# Patient Record
Sex: Female | Born: 1935 | Race: White | Hispanic: No | Smoking: Never smoker
Health system: Southern US, Community
[De-identification: ages and names within clinical notes are randomized; demographics above are authoritative.]

## PROBLEM LIST (undated history)

## (undated) DIAGNOSIS — I10 Essential (primary) hypertension: Secondary | ICD-10-CM

## (undated) DIAGNOSIS — E785 Hyperlipidemia, unspecified: Secondary | ICD-10-CM

## (undated) DIAGNOSIS — M1990 Unspecified osteoarthritis, unspecified site: Secondary | ICD-10-CM

## (undated) DIAGNOSIS — H269 Unspecified cataract: Secondary | ICD-10-CM

## (undated) DIAGNOSIS — E119 Type 2 diabetes mellitus without complications: Secondary | ICD-10-CM

## (undated) DIAGNOSIS — E271 Primary adrenocortical insufficiency: Secondary | ICD-10-CM

## (undated) HISTORY — PX: WRIST SURGERY: SHX841

## (undated) HISTORY — PX: CORNEAL TRANSPLANT: SHX108

## (undated) HISTORY — PX: LUMBAR LAMINECTOMY: SHX95

## (undated) HISTORY — PX: EYE SURGERY: SHX253

---

## 2005-02-03 ENCOUNTER — Ambulatory Visit: Payer: Self-pay | Admitting: Internal Medicine

## 2006-02-05 ENCOUNTER — Ambulatory Visit: Payer: Self-pay | Admitting: Internal Medicine

## 2007-03-02 ENCOUNTER — Ambulatory Visit: Payer: Self-pay | Admitting: Internal Medicine

## 2008-03-06 ENCOUNTER — Ambulatory Visit: Payer: Self-pay | Admitting: Internal Medicine

## 2009-03-19 ENCOUNTER — Ambulatory Visit: Payer: Self-pay | Admitting: Internal Medicine

## 2010-01-26 ENCOUNTER — Emergency Department: Payer: Self-pay | Admitting: Unknown Physician Specialty

## 2010-03-21 ENCOUNTER — Ambulatory Visit: Payer: Self-pay | Admitting: Internal Medicine

## 2010-05-04 ENCOUNTER — Emergency Department: Payer: Self-pay | Admitting: Emergency Medicine

## 2010-06-30 ENCOUNTER — Ambulatory Visit: Payer: Self-pay | Admitting: Family Medicine

## 2013-11-12 ENCOUNTER — Ambulatory Visit: Payer: Self-pay | Admitting: Physician Assistant

## 2014-10-15 ENCOUNTER — Encounter: Payer: Self-pay | Admitting: Gynecology

## 2014-10-15 ENCOUNTER — Ambulatory Visit
Admission: EM | Admit: 2014-10-15 | Discharge: 2014-10-15 | Disposition: A | Discharge status: Home / Self Care | Payer: Medicare Other | Attending: Emergency Medicine | Admitting: Emergency Medicine

## 2014-10-15 DIAGNOSIS — E119 Type 2 diabetes mellitus without complications: Secondary | ICD-10-CM | POA: Insufficient documentation

## 2014-10-15 DIAGNOSIS — X58XXXA Exposure to other specified factors, initial encounter: Secondary | ICD-10-CM | POA: Insufficient documentation

## 2014-10-15 DIAGNOSIS — E785 Hyperlipidemia, unspecified: Secondary | ICD-10-CM | POA: Insufficient documentation

## 2014-10-15 DIAGNOSIS — I10 Essential (primary) hypertension: Secondary | ICD-10-CM | POA: Insufficient documentation

## 2014-10-15 DIAGNOSIS — E271 Primary adrenocortical insufficiency: Secondary | ICD-10-CM | POA: Insufficient documentation

## 2014-10-15 DIAGNOSIS — R238 Other skin changes: Secondary | ICD-10-CM | POA: Diagnosis not present

## 2014-10-15 DIAGNOSIS — S00521A Blister (nonthermal) of lip, initial encounter: Secondary | ICD-10-CM | POA: Insufficient documentation

## 2014-10-15 DIAGNOSIS — Z7982 Long term (current) use of aspirin: Secondary | ICD-10-CM | POA: Insufficient documentation

## 2014-10-15 HISTORY — DX: Unspecified osteoarthritis, unspecified site: M19.90

## 2014-10-15 HISTORY — DX: Primary adrenocortical insufficiency: E27.1

## 2014-10-15 HISTORY — DX: Type 2 diabetes mellitus without complications: E11.9

## 2014-10-15 HISTORY — DX: Unspecified cataract: H26.9

## 2014-10-15 HISTORY — DX: Hyperlipidemia, unspecified: E78.5

## 2014-10-15 HISTORY — DX: Essential (primary) hypertension: I10

## 2014-10-15 MED ORDER — VALACYCLOVIR HCL 1 G PO TABS: 1000.0000 mg | ORAL_TABLET | Freq: Two times a day (BID) | ORAL | Status: DC

## 2014-10-15 MED ORDER — MUPIROCIN 2 % EX OINT: 1.0000 "application " | TOPICAL_OINTMENT | Freq: Three times a day (TID) | CUTANEOUS | Status: DC

## 2014-10-15 NOTE — Discharge Instructions (Signed)
I'm going to treat you empirically for cold sores, which is caused by a herpes virus. We will have a definitive diagnosis in several days. If your culture comes back negative, then this was caused by sun poisoning, and not by herpes. If this happens again, refill theValtrex. Take 2 pills in the morning and 2 pills at night. You will only need to take this for one day. Start it as soon as possible after the symptom onset.  I'm also treating you for secondary infection with the Bactroban. Use as directed. Also continue the Carmex. Marland Kitchen

## 2014-10-15 NOTE — ED Provider Notes (Signed)
HPI  SUBJECTIVE:  Jodi Best is a 81 y.o. female who presents with painful lower lip blisters that started 3 days ago. States that she was out in the sun over the weekend, but that the lip swelled up and blisters appeared several days later. She denies any prodromal symptoms. She states that she did not get sunburned on her face. She states that the she is now having burning, tingling pain with the blisters. She has tried Cormax, Neosporin and herpecintopical antiviral. There are no aggravating or alleviating factors. She denies fevers, rash elsewhere, sore throat. She does have some crusting. Had similar symptoms before when was sunburned on her leg. Past medical history of diabetes, hypertension, Addison's disease. This is under good control. No known history of HSV.  Past Medical History  Diagnosis Date  . Hypertension   . Diabetes mellitus without complication   . Osteoarthritis   . Addison disease   . Hyperlipemia   . Cataract     Past Surgical History  Procedure Laterality Date  . Lumbar laminectomy    . Eye surgery    . Corneal transplant    . Wrist surgery      No family history on file.  History  Substance Use Topics  . Smoking status: Never Smoker   . Smokeless tobacco: Not on file  . Alcohol Use: No    No current facility-administered medications for this encounter.  Current outpatient prescriptions:  .  amLODipine (NORVASC) 10 MG tablet, Take 10 mg by mouth daily., Disp: , Rfl:  .  aspirin EC 81 MG tablet, Take 81 mg by mouth daily., Disp: , Rfl:  .  calcium-vitamin D (OSCAL WITH D) 500-200 MG-UNIT per tablet, Take 1 tablet by mouth., Disp: , Rfl:  .  Cholecalciferol 1000 UNITS capsule, Take 1,000 Units by mouth daily., Disp: , Rfl:  .  fluticasone (FLONASE) 50 MCG/ACT nasal spray, Place into both nostrils daily., Disp: , Rfl:  .  hydrocortisone (CORTEF) 20 MG tablet, Take 20 mg by mouth daily., Disp: , Rfl:  .  lovastatin (MEVACOR) 40 MG tablet, Take 40 mg  by mouth at bedtime., Disp: , Rfl:  .  metFORMIN (GLUCOPHAGE) 500 MG tablet, Take by mouth 2 (two) times daily with a meal., Disp: , Rfl:  .  metoprolol (LOPRESSOR) 50 MG tablet, Take 50 mg by mouth 2 (two) times daily., Disp: , Rfl:  .  prednisoLONE acetate (PRED FORTE) 1 % ophthalmic suspension, 1 drop 4 (four) times daily., Disp: , Rfl:  .  travoprost, benzalkonium, (TRAVATAN) 0.004 % ophthalmic solution, 1 drop at bedtime., Disp: , Rfl:  .  mupirocin ointment (BACTROBAN) 2 %, Apply 1 application topically 3 (three) times daily., Disp: 22 g, Rfl: 0 .  valACYclovir (VALTREX) 1000 MG tablet, Take 1 tablet (1,000 mg total) by mouth 2 (two) times daily. X 5 days., Disp: 10 tablet, Rfl: 1  Allergies  Allergen Reactions  . Codeine     unlnown  . Penicillins     unknown     ROS  As noted in HPI.   Physical Exam  BP 141/63 mmHg  Pulse 76  Temp(Src) 97.9 F (36.6 C) (Oral)  Ht  (1.6 m)  Wt 170 lb (77.111 kg)  BMI 30.12 kg/m2  SpO2 99%  Constitutional: Well developed, well nourished, no acute distress Eyes:  EOMI, conjunctiva normal bilaterally HENT: Normocephalic, atraumatic,mucus membranes moist,+ tender erosions of her lower lip and on chin with some crusting,  oropharynx normal  Respiratory: Normal inspiratory effort Cardiovascular: Normal rate GI; nondistended skin: no other rash Lymph: No cervical lymphadenopathy Musculoskeletal: no deformities Neurologic: Alert & oriented x 3, no focal neuro deficits Psychiatric: Speech and behavior appropriate   ED Course   Medications - No data to display  Orders Placed This Encounter  Procedures  . Herpes simplex virus culture    Standing Status: Standing     Number of Occurrences: 1     Standing Expiration Date:     Order Specific Question:  Patient immune status    Answer:  Immunocompromised    No results found for this or any previous visit (from the past 24 hour(s)). No results found.  ED Clinical  Impression  Blisters of multiple sites  ED Assessment/Plan Presentation is most consistent with HSV outbreak so sent home with Valtrex.  also in the differential is sun poisoning, impetigo, patient does have some honey colored crusts with the blisters, so will treat for secondary infection with Bactroban.   Discussed labs,MDM, plan and followup with patient / family. Discussed sn/sx that should prompt return to the UC or ED. Patient  agrees with plan.   *This clinic note was created using Dragon dictation software. Therefore, there may be occasional mistakes despite careful proofreading.  ?    Domenick Gong, MD 10/15/14 1044

## 2014-10-15 NOTE — ED Notes (Signed)
Patient c/o blistered on bottom lip x 5 days.

## 2014-10-17 LAB — HERPES SIMPLEX VIRUS CULTURE

## 2014-10-18 LAB — HSV CULTURE AND TYPING

## 2016-05-16 ENCOUNTER — Inpatient Hospital Stay
Admission: EM | Admit: 2016-05-16 | Discharge: 2016-05-22 | DRG: 871 | Disposition: A | Discharge status: Skilled Nursing Facility | Payer: Medicare Other | Attending: Internal Medicine | Admitting: Internal Medicine

## 2016-05-16 ENCOUNTER — Encounter: Payer: Self-pay | Admitting: Emergency Medicine

## 2016-05-16 ENCOUNTER — Emergency Department: Payer: Medicare Other

## 2016-05-16 DIAGNOSIS — L8990 Pressure ulcer of unspecified site, unspecified stage: Secondary | ICD-10-CM | POA: Insufficient documentation

## 2016-05-16 DIAGNOSIS — Z7952 Long term (current) use of systemic steroids: Secondary | ICD-10-CM | POA: Diagnosis not present

## 2016-05-16 DIAGNOSIS — E785 Hyperlipidemia, unspecified: Secondary | ICD-10-CM | POA: Diagnosis present

## 2016-05-16 DIAGNOSIS — Z7951 Long term (current) use of inhaled steroids: Secondary | ICD-10-CM | POA: Diagnosis not present

## 2016-05-16 DIAGNOSIS — E440 Moderate protein-calorie malnutrition: Secondary | ICD-10-CM | POA: Diagnosis present

## 2016-05-16 DIAGNOSIS — M1990 Unspecified osteoarthritis, unspecified site: Secondary | ICD-10-CM | POA: Diagnosis present

## 2016-05-16 DIAGNOSIS — K5900 Constipation, unspecified: Secondary | ICD-10-CM | POA: Diagnosis present

## 2016-05-16 DIAGNOSIS — Z885 Allergy status to narcotic agent status: Secondary | ICD-10-CM

## 2016-05-16 DIAGNOSIS — N170 Acute kidney failure with tubular necrosis: Secondary | ICD-10-CM | POA: Diagnosis present

## 2016-05-16 DIAGNOSIS — J181 Lobar pneumonia, unspecified organism: Secondary | ICD-10-CM

## 2016-05-16 DIAGNOSIS — J1008 Influenza due to other identified influenza virus with other specified pneumonia: Secondary | ICD-10-CM | POA: Diagnosis present

## 2016-05-16 DIAGNOSIS — F4323 Adjustment disorder with mixed anxiety and depressed mood: Secondary | ICD-10-CM | POA: Diagnosis present

## 2016-05-16 DIAGNOSIS — Z7984 Long term (current) use of oral hypoglycemic drugs: Secondary | ICD-10-CM | POA: Diagnosis not present

## 2016-05-16 DIAGNOSIS — E871 Hypo-osmolality and hyponatremia: Secondary | ICD-10-CM | POA: Diagnosis present

## 2016-05-16 DIAGNOSIS — J9601 Acute respiratory failure with hypoxia: Secondary | ICD-10-CM | POA: Diagnosis present

## 2016-05-16 DIAGNOSIS — A4101 Sepsis due to Methicillin susceptible Staphylococcus aureus: Secondary | ICD-10-CM | POA: Diagnosis present

## 2016-05-16 DIAGNOSIS — E1122 Type 2 diabetes mellitus with diabetic chronic kidney disease: Secondary | ICD-10-CM | POA: Diagnosis present

## 2016-05-16 DIAGNOSIS — L89151 Pressure ulcer of sacral region, stage 1: Secondary | ICD-10-CM | POA: Diagnosis present

## 2016-05-16 DIAGNOSIS — M6281 Muscle weakness (generalized): Secondary | ICD-10-CM

## 2016-05-16 DIAGNOSIS — E271 Primary adrenocortical insufficiency: Secondary | ICD-10-CM | POA: Diagnosis present

## 2016-05-16 DIAGNOSIS — R05 Cough: Secondary | ICD-10-CM | POA: Diagnosis present

## 2016-05-16 DIAGNOSIS — J189 Pneumonia, unspecified organism: Secondary | ICD-10-CM | POA: Diagnosis present

## 2016-05-16 DIAGNOSIS — Z6829 Body mass index (BMI) 29.0-29.9, adult: Secondary | ICD-10-CM

## 2016-05-16 DIAGNOSIS — N183 Chronic kidney disease, stage 3 (moderate): Secondary | ICD-10-CM | POA: Diagnosis present

## 2016-05-16 DIAGNOSIS — E860 Dehydration: Secondary | ICD-10-CM | POA: Diagnosis present

## 2016-05-16 DIAGNOSIS — I129 Hypertensive chronic kidney disease with stage 1 through stage 4 chronic kidney disease, or unspecified chronic kidney disease: Secondary | ICD-10-CM | POA: Diagnosis present

## 2016-05-16 DIAGNOSIS — J15211 Pneumonia due to Methicillin susceptible Staphylococcus aureus: Secondary | ICD-10-CM | POA: Diagnosis present

## 2016-05-16 DIAGNOSIS — Z880 Allergy status to penicillin: Secondary | ICD-10-CM | POA: Diagnosis not present

## 2016-05-16 DIAGNOSIS — J101 Influenza due to other identified influenza virus with other respiratory manifestations: Secondary | ICD-10-CM

## 2016-05-16 DIAGNOSIS — Z7982 Long term (current) use of aspirin: Secondary | ICD-10-CM | POA: Diagnosis not present

## 2016-05-16 DIAGNOSIS — R262 Difficulty in walking, not elsewhere classified: Secondary | ICD-10-CM

## 2016-05-16 LAB — CBC WITH DIFFERENTIAL/PLATELET
BASOS ABS: 0 10*3/uL (ref 0–0.1)
Basophils Relative: 0 %
Eosinophils Absolute: 0 10*3/uL (ref 0–0.7)
Eosinophils Relative: 0 %
HEMATOCRIT: 36 % (ref 35.0–47.0)
Hemoglobin: 12 g/dL (ref 12.0–16.0)
LYMPHS PCT: 15 %
Lymphs Abs: 2.6 10*3/uL (ref 1.0–3.6)
MCH: 27 pg (ref 26.0–34.0)
MCHC: 33.3 g/dL (ref 32.0–36.0)
MCV: 81 fL (ref 80.0–100.0)
Monocytes Absolute: 1.4 10*3/uL — ABNORMAL HIGH (ref 0.2–0.9)
Monocytes Relative: 8 %
NEUTROS ABS: 13.7 10*3/uL — AB (ref 1.4–6.5)
Neutrophils Relative %: 77 %
PLATELETS: 586 10*3/uL — AB (ref 150–440)
RBC: 4.44 MIL/uL (ref 3.80–5.20)
RDW: 16.3 % — AB (ref 11.5–14.5)
WBC: 17.8 10*3/uL — AB (ref 3.6–11.0)

## 2016-05-16 LAB — COMPREHENSIVE METABOLIC PANEL
ALT: 11 U/L — ABNORMAL LOW (ref 14–54)
ANION GAP: 13 (ref 5–15)
AST: 22 U/L (ref 15–41)
Albumin: 3.4 g/dL — ABNORMAL LOW (ref 3.5–5.0)
Alkaline Phosphatase: 92 U/L (ref 38–126)
BILIRUBIN TOTAL: 1 mg/dL (ref 0.3–1.2)
BUN: 59 mg/dL — ABNORMAL HIGH (ref 6–20)
CO2: 19 mmol/L — ABNORMAL LOW (ref 22–32)
Calcium: 8.9 mg/dL (ref 8.9–10.3)
Chloride: 96 mmol/L — ABNORMAL LOW (ref 101–111)
Creatinine, Ser: 2.75 mg/dL — ABNORMAL HIGH (ref 0.44–1.00)
GFR, EST AFRICAN AMERICAN: 17 mL/min — AB (ref >60)
GFR, EST NON AFRICAN AMERICAN: 15 mL/min — AB (ref >60)
Glucose, Bld: 148 mg/dL — ABNORMAL HIGH (ref 65–99)
POTASSIUM: 4.7 mmol/L (ref 3.5–5.1)
Sodium: 128 mmol/L — ABNORMAL LOW (ref 135–145)
TOTAL PROTEIN: 7.9 g/dL (ref 6.5–8.1)

## 2016-05-16 LAB — GLUCOSE, CAPILLARY
Glucose-Capillary: 114 mg/dL — ABNORMAL HIGH (ref 65–99)
Glucose-Capillary: 133 mg/dL — ABNORMAL HIGH (ref 65–99)
Glucose-Capillary: 144 mg/dL — ABNORMAL HIGH (ref 65–99)

## 2016-05-16 LAB — INFLUENZA PANEL BY PCR (TYPE A & B)
Influenza A By PCR: POSITIVE — AB
Influenza B By PCR: NEGATIVE

## 2016-05-16 LAB — EXPECTORATED SPUTUM ASSESSMENT W REFEX TO RESP CULTURE: SPECIAL REQUESTS: NORMAL

## 2016-05-16 LAB — EXPECTORATED SPUTUM ASSESSMENT W GRAM STAIN, RFLX TO RESP C

## 2016-05-16 LAB — BRAIN NATRIURETIC PEPTIDE: B Natriuretic Peptide: 271 pg/mL — ABNORMAL HIGH (ref 0.0–100.0)

## 2016-05-16 LAB — TROPONIN I: Troponin I: 0.05 ng/mL (ref <0.03)

## 2016-05-16 MED ORDER — ACETAMINOPHEN 325 MG PO TABS
650.0000 mg | ORAL_TABLET | Freq: Four times a day (QID) | ORAL | Status: DC | PRN
  Administered 2016-05-19: 650 mg via ORAL
  Filled 2016-05-16 (×2): qty 2

## 2016-05-16 MED ORDER — LORATADINE 10 MG PO TABS
10.0000 mg | ORAL_TABLET | Freq: Every day | ORAL | Status: DC
  Administered 2016-05-16 – 2016-05-22 (×7): 10 mg via ORAL
  Filled 2016-05-16 (×7): qty 1

## 2016-05-16 MED ORDER — ENOXAPARIN SODIUM 30 MG/0.3ML ~~LOC~~ SOLN
30.0000 mg | SUBCUTANEOUS | Status: DC
  Administered 2016-05-16: 30 mg via SUBCUTANEOUS
  Filled 2016-05-16: qty 0.3

## 2016-05-16 MED ORDER — LEVOFLOXACIN IN D5W 750 MG/150ML IV SOLN
750.0000 mg | INTRAVENOUS | Status: AC
  Administered 2016-05-16: 750 mg via INTRAVENOUS
  Filled 2016-05-16: qty 150

## 2016-05-16 MED ORDER — OSELTAMIVIR PHOSPHATE 30 MG PO CAPS
30.0000 mg | ORAL_CAPSULE | Freq: Every day | ORAL | Status: AC
  Administered 2016-05-16 – 2016-05-20 (×5): 30 mg via ORAL
  Filled 2016-05-16 (×4): qty 1

## 2016-05-16 MED ORDER — HYDROCORTISONE 10 MG PO TABS
20.0000 mg | ORAL_TABLET | Freq: Every day | ORAL | Status: DC
  Administered 2016-05-16 – 2016-05-22 (×7): 20 mg via ORAL
  Filled 2016-05-16 (×7): qty 2

## 2016-05-16 MED ORDER — CALCIUM CARBONATE ANTACID 500 MG PO CHEW
1500.0000 mg | CHEWABLE_TABLET | Freq: Two times a day (BID) | ORAL | Status: DC
  Administered 2016-05-16 – 2016-05-21 (×10): 1500 mg via ORAL
  Filled 2016-05-16 (×10): qty 3

## 2016-05-16 MED ORDER — ACETAMINOPHEN 650 MG RE SUPP: 650.0000 mg | Freq: Four times a day (QID) | RECTAL | Status: DC | PRN

## 2016-05-16 MED ORDER — ONDANSETRON HCL 4 MG PO TABS: 4.0000 mg | ORAL_TABLET | Freq: Four times a day (QID) | ORAL | Status: DC | PRN

## 2016-05-16 MED ORDER — IPRATROPIUM-ALBUTEROL 0.5-2.5 (3) MG/3ML IN SOLN
3.0000 mL | Freq: Once | RESPIRATORY_TRACT | Status: AC
  Administered 2016-05-16: 3 mL via RESPIRATORY_TRACT
  Filled 2016-05-16: qty 3

## 2016-05-16 MED ORDER — SODIUM CHLORIDE 0.9 % IV SOLN
INTRAVENOUS | Status: AC
  Administered 2016-05-16 – 2016-05-18 (×5): via INTRAVENOUS

## 2016-05-16 MED ORDER — ONDANSETRON HCL 4 MG/2ML IJ SOLN: 4.0000 mg | Freq: Four times a day (QID) | INTRAMUSCULAR | Status: DC | PRN

## 2016-05-16 MED ORDER — VITAMIN D 1000 UNITS PO TABS
1000.0000 [IU] | ORAL_TABLET | Freq: Every day | ORAL | Status: DC
  Administered 2016-05-16 – 2016-05-22 (×7): 1000 [IU] via ORAL
  Filled 2016-05-16 (×7): qty 1

## 2016-05-16 MED ORDER — OSELTAMIVIR PHOSPHATE 75 MG PO CAPS
75.0000 mg | ORAL_CAPSULE | Freq: Once | ORAL | Status: DC
  Filled 2016-05-16: qty 1

## 2016-05-16 MED ORDER — GUAIFENESIN ER 600 MG PO TB12
600.0000 mg | ORAL_TABLET | Freq: Two times a day (BID) | ORAL | Status: DC
Start: 2016-05-16 — End: 2016-05-16
  Filled 2016-05-16: qty 1

## 2016-05-16 MED ORDER — VALACYCLOVIR HCL 500 MG PO TABS
1000.0000 mg | ORAL_TABLET | Freq: Every day | ORAL | Status: DC
  Administered 2016-05-16 – 2016-05-19 (×4): 1000 mg via ORAL
  Filled 2016-05-16 (×4): qty 2

## 2016-05-16 MED ORDER — INSULIN ASPART 100 UNIT/ML ~~LOC~~ SOLN
0.0000 [IU] | Freq: Three times a day (TID) | SUBCUTANEOUS | Status: DC
  Administered 2016-05-16 – 2016-05-18 (×5): 1 [IU] via SUBCUTANEOUS
  Administered 2016-05-20 – 2016-05-22 (×3): 2 [IU] via SUBCUTANEOUS
  Filled 2016-05-16: qty 1
  Filled 2016-05-16 (×2): qty 2
  Filled 2016-05-16 (×3): qty 1
  Filled 2016-05-16: qty 2
  Filled 2016-05-16: qty 1

## 2016-05-16 MED ORDER — ORAL CARE MOUTH RINSE
15.0000 mL | Freq: Two times a day (BID) | OROMUCOSAL | Status: DC
  Administered 2016-05-16 – 2016-05-22 (×12): 15 mL via OROMUCOSAL

## 2016-05-16 MED ORDER — GUAIFENESIN ER 600 MG PO TB12
600.0000 mg | ORAL_TABLET | Freq: Two times a day (BID) | ORAL | Status: DC
  Administered 2016-05-16 – 2016-05-22 (×13): 600 mg via ORAL
  Filled 2016-05-16 (×12): qty 1

## 2016-05-16 MED ORDER — AMLODIPINE BESYLATE 10 MG PO TABS: 10.0000 mg | ORAL_TABLET | Freq: Every day | ORAL | Status: DC

## 2016-05-16 MED ORDER — DEXTROMETHORPHAN POLISTIREX ER 30 MG/5ML PO SUER
30.0000 mg | Freq: Two times a day (BID) | ORAL | Status: DC
  Administered 2016-05-16 – 2016-05-22 (×13): 30 mg via ORAL
  Filled 2016-05-16 (×17): qty 5

## 2016-05-16 MED ORDER — FLUTICASONE PROPIONATE 50 MCG/ACT NA SUSP
2.0000 | Freq: Every day | NASAL | Status: DC
  Administered 2016-05-16 – 2016-05-21 (×6): 2 via NASAL
  Filled 2016-05-16: qty 16

## 2016-05-16 MED ORDER — TRAVOPROST (BAK FREE) 0.004 % OP SOLN
1.0000 [drp] | Freq: Every day | OPHTHALMIC | Status: DC
  Administered 2016-05-17 – 2016-05-21 (×5): 1 [drp] via OPHTHALMIC
  Filled 2016-05-16: qty 2.5

## 2016-05-16 MED ORDER — PRAVASTATIN SODIUM 20 MG PO TABS
20.0000 mg | ORAL_TABLET | Freq: Every day | ORAL | Status: DC
  Administered 2016-05-16 – 2016-05-21 (×6): 20 mg via ORAL
  Filled 2016-05-16 (×6): qty 1

## 2016-05-16 MED ORDER — ASPIRIN EC 81 MG PO TBEC
81.0000 mg | DELAYED_RELEASE_TABLET | Freq: Every day | ORAL | Status: DC
  Administered 2016-05-16 – 2016-05-22 (×7): 81 mg via ORAL
  Filled 2016-05-16 (×7): qty 1

## 2016-05-16 MED ORDER — DM-GUAIFENESIN ER 30-600 MG PO TB12: 1.0000 | ORAL_TABLET | Freq: Two times a day (BID) | ORAL | Status: DC

## 2016-05-16 MED ORDER — LEVOFLOXACIN IN D5W 500 MG/100ML IV SOLN
500.0000 mg | INTRAVENOUS | Status: DC
  Filled 2016-05-16: qty 100

## 2016-05-16 MED ORDER — METOPROLOL TARTRATE 50 MG PO TABS
75.0000 mg | ORAL_TABLET | Freq: Two times a day (BID) | ORAL | Status: DC
  Administered 2016-05-16: 23:00:00 75 mg via ORAL
  Filled 2016-05-16: qty 1

## 2016-05-16 NOTE — ED Notes (Signed)
On arrival pt SpO2 84% RA, pt placed on 2-4L O2 via [place], SpO2 increased to 88%. Pt placed on NRB and HOB elevated to 90 degrees, SpO2 increased to 97%.

## 2016-05-16 NOTE — H&P (Addendum)
[facility] [place] [facility]   PATIENT NAME: Jodi Best    MR#:  [id number]  DATE OF BIRTH:  [birth year]  DATE OF ADMISSION:  05/16/2016  PRIMARY CARE PHYSICIAN:  Dr. Sydell Axon  REQUESTING/REFERRING PHYSICIAN:  MCshane MD  CHIEF COMPLAINT:   Chief Complaint  Patient presents with  . Flu-like symptoms    HISTORY OF PRESENT ILLNESS: Jodi Best  is a 83 y.o. female with a known history of Addison's disease, cataracts, diabetes type 2, essential hypertension, hyperlipidemia, osteoarthritis who is presenting with complaint of feeling weak and fatigue and now short of breath. According to the daughter she started feeling sick since [place]. Was nauseous and throwing up. She was seen by her primary care provider and told him that she had a virus. And then over the past few days patient has had trouble with breathing. She has not had any fevers according to the daughter but has not been feeling well. In the emergency room she was noted to have pneumonia as well as the flu.     PAST MEDICAL HISTORY:   Past Medical History:  Diagnosis Date  . Addison disease (HCC)   . Cataract   . Diabetes mellitus without complication (HCC)   . Hyperlipemia   . Hypertension   . Osteoarthritis     PAST SURGICAL HISTORY:  Past Surgical History:  Procedure Laterality Date  . CORNEAL TRANSPLANT    . EYE SURGERY    . LUMBAR LAMINECTOMY    . WRIST SURGERY      SOCIAL HISTORY:  Social History  Substance Use Topics  . Smoking status: Never Smoker  . Smokeless tobacco: Not on file  . Alcohol use No    FAMILY HISTORY:  Family History  Problem Relation Age of Onset  . Hypertension Mother     DRUG ALLERGIES:  Allergies  Allergen Reactions  . Codeine     unlnown  . Penicillins     Has patient had a PCN reaction causing immediate rash, facial/tongue/throat swelling, SOB or lightheadedness with hypotension: no Has patient had a PCN reaction causing severe rash  involving mucus membranes or skin necrosis: no Has patient had a PCN reaction that required hospitalization no Has patient had a PCN reaction occurring within the last 10 years: yes If all of the above answers are "NO", then may proceed with Cephalosporin use.     REVIEW OF SYSTEMS:   CONSTITUTIONAL: No fever, Positive fatigue positive weakness.  EYES: No blurred or double vision.  EARS, NOSE, AND THROAT: No tinnitus or ear pain.  RESPIRATORY: No cough, positive shortness of breath, no wheezing or hemoptysis.  CARDIOVASCULAR: No chest pain, orthopnea, edema.  GASTROINTESTINAL: No nausea, vomiting, diarrhea or abdominal pain.  GENITOURINARY: No dysuria, hematuria.  ENDOCRINE: No polyuria, nocturia,  HEMATOLOGY: No anemia, easy bruising or bleeding SKIN: No rash or lesion. MUSCULOSKELETAL: No joint pain or arthritis.   NEUROLOGIC: No tingling, numbness, weakness.  PSYCHIATRY: No anxiety or depression.   MEDICATIONS AT HOME:  Prior to Admission medications   Medication Sig Start Date End Date Taking? Authorizing Provider  amLODipine (NORVASC) 10 MG tablet Take 10 mg by mouth daily.    Historical Provider, MD  aspirin EC 81 MG tablet Take 81 mg by mouth daily.    Historical Provider, MD  calcium-vitamin D (OSCAL WITH D) 500-200 MG-UNIT per tablet Take 1 tablet by mouth.    Historical Provider, MD  Cholecalciferol 1000 UNITS capsule Take 1,000 Units by mouth  daily.    Historical Provider, MD  fluticasone (FLONASE) 50 MCG/ACT nasal spray Place into both nostrils daily.    Historical Provider, MD  hydrocortisone (CORTEF) 20 MG tablet Take 20 mg by mouth daily.    Historical Provider, MD  lovastatin (MEVACOR) 40 MG tablet Take 40 mg by mouth at bedtime.    Historical Provider, MD  metFORMIN (GLUCOPHAGE) 500 MG tablet Take by mouth 2 (two) times daily with a meal.    Historical Provider, MD  metoprolol (LOPRESSOR) 50 MG tablet Take 50 mg by mouth 2 (two) times daily.    Historical Provider,  MD  mupirocin ointment (BACTROBAN) 2 % Apply 1 application topically 3 (three) times daily. 10/15/14   Domenick Gong, MD  prednisoLONE acetate (PRED FORTE) 1 % ophthalmic suspension 1 drop 4 (four) times daily.    Historical Provider, MD  travoprost, benzalkonium, (TRAVATAN) 0.004 % ophthalmic solution 1 drop at bedtime.    Historical Provider, MD  valACYclovir (VALTREX) 1000 MG tablet Take 1 tablet (1,000 mg total) by mouth 2 (two) times daily. X 5 days. 10/15/14   Domenick Gong, MD      PHYSICAL EXAMINATION:   VITAL SIGNS: Blood pressure (!) 146/53, pulse 93, temperature 98.2 F (36.8 C), temperature source Oral, resp. rate 16, height 5\' 3"  (1.6 m), weight 165 lb (74.8 kg), SpO2 97 %.  GENERAL:  83 y.o.-year-old patient lying in the bed with no acute distress.  EYES: Pupils equal, round, reactive to light and accommodation. No scleral icterus. Extraocular muscles intact.  HEENT: Head atraumatic, normocephalic. Oropharynx and nasopharynx clear.  NECK:  Supple, no jugular venous distention. No thyroid enlargement, no tenderness.  LUNGS: Left lung rhonchus breath sounds, no wheezing no crackles CARDIOVASCULAR: S1, S2 normal. No murmurs, rubs, or gallops.  ABDOMEN: Soft, nontender, nondistended. Bowel sounds present. No organomegaly or mass.  EXTREMITIES: No pedal edema, cyanosis, or clubbing.  NEUROLOGIC: Cranial nerves II through XII are intact. Muscle strength 5/5 in all extremities. Sensation intact. Gait not checked.  PSYCHIATRIC: The patient is alert and oriented x 3.  SKIN: No obvious rash, lesion, or ulcer.   LABORATORY PANEL:   CBC  Recent Labs Lab 05/16/16 0909  WBC 17.8*  HGB 12.0  HCT 36.0  PLT 586*  MCV 81.0  MCH 27.0  MCHC 33.3  RDW 16.3*  LYMPHSABS 2.6  MONOABS 1.4*  EOSABS 0.0  BASOSABS 0.0   ------------------------------------------------------------------------------------------------------------------  Chemistries   Recent Labs Lab  05/16/16 0909  NA 128*  K 4.7  CL 96*  CO2 19*  GLUCOSE 148*  BUN 59*  CREATININE 2.75*  CALCIUM 8.9  AST 22  ALT 11*  ALKPHOS 92  BILITOT 1.0   ------------------------------------------------------------------------------------------------------------------ estimated creatinine clearance is 14.5 mL/min (by C-G formula based on SCr of 2.75 mg/dL (H)). ------------------------------------------------------------------------------------------------------------------ No results for input(s): TSH, T4TOTAL, T3FREE, THYROIDAB in the last 72 hours.  Invalid input(s): FREET3   Coagulation profile No results for input(s): INR, PROTIME in the last 168 hours. ------------------------------------------------------------------------------------------------------------------- No results for input(s): DDIMER in the last 72 hours. -------------------------------------------------------------------------------------------------------------------  Cardiac Enzymes  Recent Labs Lab 05/16/16 0909  TROPONINI 0.05*   ------------------------------------------------------------------------------------------------------------------ Invalid input(s): POCBNP  ---------------------------------------------------------------------------------------------------------------  Urinalysis No results found for: COLORURINE, APPEARANCEUR, LABSPEC, PHURINE, GLUCOSEU, HGBUR, BILIRUBINUR, KETONESUR, PROTEINUR, UROBILINOGEN, NITRITE, LEUKOCYTESUR   RADIOLOGY: Dg Chest 2 View  Result Date: 05/16/2016 CLINICAL DATA:  Shortness of breath and weakness for 2 weeks with productive cough. EXAM: CHEST  2 VIEW COMPARISON:  Single-view of the chest 01/26/2010.  FINDINGS: Focal left basilar airspace disease is identified. Right lung is clear. Heart size is mildly enlarged. No pneumothorax or pleural effusion. Aortic atherosclerosis is seen. No acute bony abnormality. IMPRESSION: Focal left basilar airspace disease  worrisome for pneumonia. Cardiomegaly without edema. Atherosclerosis. Electronically Signed   By: Drusilla Kannerhomas  Dalessio M.D.   On: 05/16/2016 09:09    EKG: Orders placed or performed during the hospital encounter of 05/16/16  . ED EKG  . ED EKG    IMPRESSION AND PLAN: Ration is a 83 year old with shortness of breath and fatigue and sob  1. Acute hypoxic respiratory failure Due to pneumonia We will treat with IV Levaquin Continue oxygen for supportive care  2. Influenza a We'll treat with Tamiflu  3. Addison's disease blood pressure is currently stable we'll continue her home regimen  4. Essential hypertension continue therapy with norvasc  5. Diabetes type 2 on sliding scale insulin and discontinue metformin and check a hemoglobin A1c  6. Acute renal failure will stop Lasix derived fluids monitor renal function  7. CODE STATUS discussed with the daughter she wants her mother to be full code  8. Miscellaneous Lovenox for DVT prophylaxis   All the records are reviewed and case discussed with ED provider. Management plans discussed with the patient, family and they are in agreement.  CODE STATUS: Code Status History    This patient does not have a recorded code status. Please follow your organizational policy for patients in this situation.       TOTAL TIME TAKING CARE OF THIS PATIENT: 55minutes.    Auburn BilberryPATEL, Annastyn Silvey M.D on 05/16/2016 at 11:18 AM  Between 7am to 6pm - Pager - [phone]  After 6pm go to [email or web address] - password [email or web address] [facility]  [facility]  Office  [phone]  CC: Primary care physician; No PCP Per Patient

## 2016-05-16 NOTE — ED Notes (Signed)
Dr. Quigley notified of troponin 0.05 

## 2016-05-16 NOTE — ED Notes (Signed)
Pt placed on monitor and O2. Dr. Huel CoteQuigley in room. Vitals taken.

## 2016-05-16 NOTE — ED Triage Notes (Signed)
Pt arrived via EMS from home for reports of flu-like symptoms for two weeks. Pt has been seen by PCP and has CXR scheduled for 0900 today. Pt daughter called EMS because pt would not come downstairs. EMS reports 160/68, 98.4 oral temperature, 94% RA.

## 2016-05-16 NOTE — ED Notes (Signed)
Patient transported to X-ray 

## 2016-05-16 NOTE — ED Provider Notes (Signed)
Time Seen: Approximately *0832  I have reviewed the triage notes  Chief Complaint: Flu-like symptoms   History of Present Illness: Jodi Best is a 83 y.o. female *who presents with some viral type symptoms over the last 2 weeks. She previously had some nausea vomiting and some loose watery stool. Now patient's presenting with more respiratory complaints. She's had a cough which is overall been dry and nonproductive. No fever. She had seen her primary physician was referred for a chest x-ray but she had increasing shortness of breath this morning. She denies any chest pain, leg pain or swelling   Past Medical History:  Diagnosis Date  . Addison disease (HCC)   . Cataract   . Diabetes mellitus without complication (HCC)   . Hyperlipemia   . Hypertension   . Osteoarthritis     There are no active problems to display for this patient.   Past Surgical History:  Procedure Laterality Date  . CORNEAL TRANSPLANT    . EYE SURGERY    . LUMBAR LAMINECTOMY    . WRIST SURGERY      Past Surgical History:  Procedure Laterality Date  . CORNEAL TRANSPLANT    . EYE SURGERY    . LUMBAR LAMINECTOMY    . WRIST SURGERY      Current Outpatient Rx  . Order #: [id number] Class: Historical Med  . Order #: [id number] Class: Historical Med  . Order #: [id number] Class: Historical Med  . Order #: [id number] Class: Historical Med  . Order #: [id number] Class: Historical Med  . Order #: [id number] Class: Historical Med  . Order #: [id number] Class: Historical Med  . Order #: [id number] Class: Historical Med  . Order #: [id number] Class: Historical Med  . Order #: [id number] Class: Print  . Order #: [id number] Class: Historical Med  . Order #: [id number] Class: Historical Med  . Order #: [id number] Class: Print    Allergies:  Codeine and Penicillins  Family History: No family history on file.  Social History: Social History  Substance Use Topics  . Smoking status: Never Smoker  . Smokeless tobacco:  Not on file  . Alcohol use No     Review of Systems:   10 point review of systems was performed and was otherwise negative:  Constitutional: No fever Eyes: No visual disturbances ENT: No sore throat, ear pain Cardiac: No chest pain Respiratory:Increasing shortness of breath with dry nonproductive cough Abdomen: No abdominal pain, no vomiting, No diarrhea Endocrine: No weight loss, No night sweats Extremities: No peripheral edema, cyanosis Skin: No rashes, easy bruising Neurologic: No focal weakness, trouble with speech or swollowing Urologic: No dysuria, Hematuria, or urinary frequency   Physical Exam:  ED Triage Vitals  Enc Vitals Group     BP 05/16/16 0837 (!) 146/53     Pulse Rate 05/16/16 0837 93     Resp 05/16/16 0837 16     Temp 05/16/16 0837 98.2 F (36.8 C)     Temp Source 05/16/16 0837 Oral     SpO2 05/16/16 0837 97 %     Weight 05/16/16 0839 165 lb (74.8 kg)     Height 05/16/16 0839 5\' 3"  (1.6 m)     Head Circumference --      Peak Flow --      Pain Score --      Pain Loc --      Pain Edu? --      Excl. in GC? --     General: Awake , Alert , and Oriented times 3; GCS 15  Head: Normal cephalic , atraumatic Eyes: Pupils equal , round, reactive to light Nose/Throat: No nasal drainage, patent upper airway without erythema or exudate.  Neck: Supple, Full range of motion, No anterior adenopathy or palpable thyroid masses Lungs: Mild rhonchi heard bilaterally to bases with no wheezes or rales noted  Heart: Regular rate, regular rhythm without murmurs , gallops , or rubs Abdomen: Soft, non tender without rebound, guarding , or rigidity; bowel sounds positive and symmetric in all 4 quadrants. No organomegaly .        Extremities: 2 plus symmetric pulses. No edema, clubbing or cyanosis Neurologic: normal ambulation, Motor symmetric without deficits, sensory intact Skin: warm, dry, no rashes   Labs:   All laboratory work was reviewed including any pertinent  negatives or positives listed below:  Labs Reviewed  RAPID INFLUENZA A&B ANTIGENS ([facility] ONLY)  BRAIN NATRIURETIC PEPTIDE  COMPREHENSIVE METABOLIC PANEL  CBC WITH DIFFERENTIAL/PLATELET  TROPONIN I  Patient's troponin is only slightly elevated. Influenza testing was positive for influenza A  Blood cultures 2 are pending  EKG:  ED ECG REPORT I, Jennye MoccasinBrian S Shneur [place], the attending physician, personally viewed and interpreted this ECG.  Date: 05/16/2016 EKG Time: 0907 Rate: 77 Rhythm: normal sinus rhythm QRS Axis: normal Intervals: normal ST/T Wave abnormalities: Nonspecific T wave abnormality Conduction Disturbances: none Narrative Interpretation: unremarkable No acute ischemic change  Radiology: * "Dg Chest 2 View  Result Date: 05/16/2016 CLINICAL DATA:  Shortness of breath and weakness for 2 weeks with productive cough. EXAM: CHEST  2 VIEW COMPARISON:  Single-view of the chest 01/26/2010. FINDINGS: Focal left basilar airspace disease is identified. Right lung is clear. Heart size is mildly enlarged. No pneumothorax or pleural effusion. Aortic atherosclerosis is seen. No acute bony abnormality. IMPRESSION: Focal left basilar airspace disease worrisome for pneumonia. Cardiomegaly without edema. Atherosclerosis. Electronically Signed   By: Drusilla Kannerhomas  Dalessio M.D.   On: 05/16/2016 09:09  "    I personally reviewed the radiologic studies  CRITICAL CARE Performed by: Jennye MoccasinBrian S Azure Budnick   Total critical care time: 35 minutes  Critical care time was exclusive of separately billable procedures and treating other patients.  Critical care was necessary to treat or prevent imminent or life-threatening deterioration.  Critical care was time spent personally by me on the following activities: development of treatment plan with patient and/or surrogate as well as nursing, discussions with consultants, evaluation of patient's response to treatment, examination of patient, obtaining history from  patient or surrogate, ordering and performing treatments and interventions, ordering and review of laboratory studies, ordering and review of radiographic studies, pulse oximetry and re-evaluation of patient's condition.    ED Course: * Patient's stay here was uneventful patient's otherwise hemodynamically stable. The patient does have hypoxemia with her pneumonia which may be either secondary to community-acquired bacterial pneumonia versus strictly influenza. She was given a DuoNeb here, fluid bolus due to dehydration and renal insufficiency and was started on Levaquin 750 mg IV after blood cultures 2. He remains slightly hypoxic when off of the nonrebreather at this time after her breathing treatment require further inpatient management  Clinical Course      Assessment: * Computed acquired pneumonia Influenza A Renal insufficiency Hypoxemia    Plan:  Inpatient            Jennye MoccasinBrian S Yasmyn Bellisario, MD 05/16/16 1028

## 2016-05-16 NOTE — Progress Notes (Signed)
Advanced care plan.  Purpose of the Encounter: CODE STATUS  Parties in Attendance:Daughter and Patient  Patient's Decision Capacity: Limited  Subjective/Patient's story: Pt is 83 y.o presents with weakness, noted to have PNA and Flu   Objective/Medical story Code status was discussed with daughter she wants her mother to have everything done including full code   Goals of care determination: agressive care    CODE STATUS:  FULL  Time spent discussing advanced care planning: 15 minutes

## 2016-05-17 DIAGNOSIS — L8990 Pressure ulcer of unspecified site, unspecified stage: Secondary | ICD-10-CM | POA: Insufficient documentation

## 2016-05-17 LAB — BASIC METABOLIC PANEL
ANION GAP: 11 (ref 5–15)
BUN: 60 mg/dL — ABNORMAL HIGH (ref 6–20)
CO2: 18 mmol/L — ABNORMAL LOW (ref 22–32)
Calcium: 8.3 mg/dL — ABNORMAL LOW (ref 8.9–10.3)
Chloride: 97 mmol/L — ABNORMAL LOW (ref 101–111)
Creatinine, Ser: 2.95 mg/dL — ABNORMAL HIGH (ref 0.44–1.00)
GFR calc Af Amer: 16 mL/min — ABNORMAL LOW (ref >60)
GFR, EST NON AFRICAN AMERICAN: 13 mL/min — AB (ref >60)
GLUCOSE: 84 mg/dL (ref 65–99)
POTASSIUM: 4.9 mmol/L (ref 3.5–5.1)
SODIUM: 126 mmol/L — AB (ref 135–145)

## 2016-05-17 LAB — BLOOD CULTURE ID PANEL (REFLEXED)
Acinetobacter baumannii: NOT DETECTED
CANDIDA GLABRATA: NOT DETECTED
CANDIDA KRUSEI: NOT DETECTED
CANDIDA PARAPSILOSIS: NOT DETECTED
Candida albicans: NOT DETECTED
Candida tropicalis: NOT DETECTED
ENTEROBACTER CLOACAE COMPLEX: NOT DETECTED
ESCHERICHIA COLI: NOT DETECTED
Enterobacteriaceae species: NOT DETECTED
Enterococcus species: NOT DETECTED
Haemophilus influenzae: NOT DETECTED
KLEBSIELLA OXYTOCA: NOT DETECTED
Klebsiella pneumoniae: NOT DETECTED
LISTERIA MONOCYTOGENES: NOT DETECTED
Methicillin resistance: NOT DETECTED
Neisseria meningitidis: NOT DETECTED
PROTEUS SPECIES: NOT DETECTED
Pseudomonas aeruginosa: NOT DETECTED
STREPTOCOCCUS PNEUMONIAE: NOT DETECTED
Serratia marcescens: NOT DETECTED
Staphylococcus aureus (BCID): DETECTED — AB
Staphylococcus species: DETECTED — AB
Streptococcus agalactiae: NOT DETECTED
Streptococcus pyogenes: NOT DETECTED
Streptococcus species: NOT DETECTED

## 2016-05-17 LAB — GLUCOSE, CAPILLARY
GLUCOSE-CAPILLARY: 143 mg/dL — AB (ref 65–99)
Glucose-Capillary: 86 mg/dL (ref 65–99)
Glucose-Capillary: 128 mg/dL — ABNORMAL HIGH (ref 65–99)
Glucose-Capillary: 147 mg/dL — ABNORMAL HIGH (ref 65–99)

## 2016-05-17 LAB — CBC
HCT: 31.6 % — ABNORMAL LOW (ref 35.0–47.0)
Hemoglobin: 10.4 g/dL — ABNORMAL LOW (ref 12.0–16.0)
MCH: 27 pg (ref 26.0–34.0)
MCHC: 32.8 g/dL (ref 32.0–36.0)
MCV: 82.2 fL (ref 80.0–100.0)
PLATELETS: 482 10*3/uL — AB (ref 150–440)
RBC: 3.84 MIL/uL (ref 3.80–5.20)
RDW: 16.5 % — ABNORMAL HIGH (ref 11.5–14.5)
WBC: 25 10*3/uL — AB (ref 3.6–11.0)

## 2016-05-17 MED ORDER — BENZONATATE 100 MG PO CAPS
100.0000 mg | ORAL_CAPSULE | Freq: Three times a day (TID) | ORAL | Status: DC
  Administered 2016-05-17 (×3): 100 mg via ORAL
  Filled 2016-05-17 (×4): qty 1

## 2016-05-17 MED ORDER — SENNOSIDES-DOCUSATE SODIUM 8.6-50 MG PO TABS
2.0000 | ORAL_TABLET | Freq: Every day | ORAL | Status: DC
  Administered 2016-05-17 – 2016-05-22 (×6): 2 via ORAL
  Filled 2016-05-17 (×6): qty 2

## 2016-05-17 MED ORDER — HEPARIN SODIUM (PORCINE) 5000 UNIT/ML IJ SOLN
5000.0000 [IU] | Freq: Three times a day (TID) | INTRAMUSCULAR | Status: DC
  Administered 2016-05-17 – 2016-05-21 (×12): 5000 [IU] via SUBCUTANEOUS
  Filled 2016-05-17 (×13): qty 1

## 2016-05-17 MED ORDER — METOPROLOL TARTRATE 25 MG PO TABS
12.5000 mg | ORAL_TABLET | Freq: Two times a day (BID) | ORAL | Status: DC
  Administered 2016-05-17 – 2016-05-22 (×11): 12.5 mg via ORAL
  Filled 2016-05-17 (×11): qty 1

## 2016-05-17 MED ORDER — POLYETHYLENE GLYCOL 3350 17 G PO PACK
17.0000 g | PACK | Freq: Every day | ORAL | Status: DC | PRN
  Administered 2016-05-17 – 2016-05-20 (×2): 17 g via ORAL
  Filled 2016-05-17 (×3): qty 1

## 2016-05-17 NOTE — Progress Notes (Signed)
Enoxaparin   Patient does not qualify for Enoxaparin due to CrCl <15 ml/min. [facility].    Demetrius Charityeldrin D. Dejanique Ruehl, PharmD

## 2016-05-17 NOTE — Progress Notes (Signed)
PHARMACY - PHYSICIAN COMMUNICATION CRITICAL VALUE ALERT - BLOOD CULTURE IDENTIFICATION (BCID)  Results for orders placed or performed during the hospital encounter of 05/16/16  Blood Culture ID Panel (Reflexed) (Collected: 05/16/2016 10:01 AM)  Result Value Ref Range   Enterococcus species NOT DETECTED NOT DETECTED   Listeria monocytogenes NOT DETECTED NOT DETECTED   Staphylococcus species DETECTED (A) NOT DETECTED   Staphylococcus aureus DETECTED (A) NOT DETECTED   Methicillin resistance NOT DETECTED NOT DETECTED   Streptococcus species NOT DETECTED NOT DETECTED   Streptococcus agalactiae NOT DETECTED NOT DETECTED   Streptococcus pneumoniae NOT DETECTED NOT DETECTED   Streptococcus pyogenes NOT DETECTED NOT DETECTED   Acinetobacter baumannii NOT DETECTED NOT DETECTED   Enterobacteriaceae species NOT DETECTED NOT DETECTED   Enterobacter cloacae complex NOT DETECTED NOT DETECTED   Escherichia coli NOT DETECTED NOT DETECTED   Klebsiella oxytoca NOT DETECTED NOT DETECTED   Klebsiella pneumoniae NOT DETECTED NOT DETECTED   Proteus species NOT DETECTED NOT DETECTED   Serratia marcescens NOT DETECTED NOT DETECTED   Haemophilus influenzae NOT DETECTED NOT DETECTED   Neisseria meningitidis NOT DETECTED NOT DETECTED   Pseudomonas aeruginosa NOT DETECTED NOT DETECTED   Candida albicans NOT DETECTED NOT DETECTED   Candida glabrata NOT DETECTED NOT DETECTED   Candida krusei NOT DETECTED NOT DETECTED   Candida parapsilosis NOT DETECTED NOT DETECTED   Candida tropicalis NOT DETECTED NOT DETECTED    Name of physician (or Provider) Contacted: Chen  Changes to prescribed antibiotics required: patient on levofloxacin to cover possible PNA. No change at this time.   Thelda Gagan L 05/17/2016  7:46 PM

## 2016-05-17 NOTE — Plan of Care (Signed)
Problem: Education: Goal: Knowledge of [facility] Education information/materials will improve Outcome: Not Progressing Patient is confused at times.  Problem: Health Behavior/Discharge Planning: Goal: Ability to manage health-related needs will improve Outcome: Not Progressing Patient is confused at times.   

## 2016-05-17 NOTE — Progress Notes (Signed)
[facility] [place] [facility]   PATIENT NAME: Jodi Best    MR#:  [id number]  DATE OF BIRTH:  [birth year]  SUBJECTIVE:  CHIEF COMPLAINT:   Chief Complaint  Patient presents with  . Flu-like symptoms   -Admitted with flu and superimposed pneumonia. -Complains of cough and decreased appetite. depressed.  REVIEW OF SYSTEMS:  Review of Systems  Constitutional: Positive for chills and malaise/fatigue. Negative for fever.  HENT: Positive for congestion and hearing loss. Negative for ear discharge, ear pain and nosebleeds.   Eyes: Negative for blurred vision and double vision.  Respiratory: Positive for cough, sputum production and shortness of breath. Negative for wheezing.   Cardiovascular: Negative for chest pain, palpitations and leg swelling.  Gastrointestinal: Negative for abdominal pain, constipation, diarrhea, nausea and vomiting.       Decreased appetite  Genitourinary: Negative for dysuria.  Musculoskeletal: Negative for myalgias.  Neurological: Positive for weakness. Negative for dizziness, speech change, focal weakness, seizures and headaches.  Psychiatric/Behavioral: Negative for depression.    DRUG ALLERGIES:   Allergies  Allergen Reactions  . Codeine     unlnown  . Penicillins     Has patient had a PCN reaction causing immediate rash, facial/tongue/throat swelling, SOB or lightheadedness with hypotension: no Has patient had a PCN reaction causing severe rash involving mucus membranes or skin necrosis: no Has patient had a PCN reaction that required hospitalization no Has patient had a PCN reaction occurring within the last 10 years: yes If all of the above answers are "NO", then may proceed with Cephalosporin use.     VITALS:  Blood pressure (!) 128/43, pulse 78, temperature 98.9 F (37.2 C), temperature source Oral, resp. rate 16, height 5\' 3"  (1.6 m), weight 74.8 kg (165 lb), SpO2 98 %.  PHYSICAL EXAMINATION:  Physical  Exam  GENERAL:  83 y.o.-year-old patient lying in the bed with no acute distress. Ill appearing EYES: Pupils equal, round, reactive to light and accommodation. No scleral icterus. Extraocular muscles intact.  HEENT: Head atraumatic, normocephalic. Oropharynx and nasopharynx clear.  NECK:  Supple, no jugular venous distention. No thyroid enlargement, no tenderness.  LUNGS: Normal breath sounds bilaterally, no wheezing, rales or crepitation. No use of accessory muscles of respiration. I basilar rhonchi heard CARDIOVASCULAR: S1, S2 normal. No  rubs, or gallops. 2/6 systolic murmur present ABDOMEN: Soft, nontender, nondistended. Bowel sounds present. No organomegaly or mass.  EXTREMITIES: No pedal edema, cyanosis, or clubbing.  NEUROLOGIC: Cranial nerves II through XII are intact. Muscle strength 5/5 in all extremities. Sensation intact. Gait not checked. Global  weakness noted PSYCHIATRIC: The patient is alert and oriented x 3.  SKIN: No obvious rash, lesion, or ulcer.    LABORATORY PANEL:   CBC  Recent Labs Lab 05/17/16 0523  WBC 25.0*  HGB 10.4*  HCT 31.6*  PLT 482*   ------------------------------------------------------------------------------------------------------------------  Chemistries   Recent Labs Lab 05/16/16 0909 05/17/16 0523  NA 128* 126*  K 4.7 4.9  CL 96* 97*  CO2 19* 18*  GLUCOSE 148* 84  BUN 59* 60*  CREATININE 2.75* 2.95*  CALCIUM 8.9 8.3*  AST 22  --   ALT 11*  --   ALKPHOS 92  --   BILITOT 1.0  --    ------------------------------------------------------------------------------------------------------------------  Cardiac Enzymes  Recent Labs Lab 05/16/16 0909  TROPONINI 0.05*   ------------------------------------------------------------------------------------------------------------------  RADIOLOGY:  Dg Chest 2 View  Result Date: 05/16/2016 CLINICAL DATA:  Shortness of breath and weakness for 2 weeks  with productive cough. EXAM:  CHEST  2 VIEW COMPARISON:  Single-view of the chest 01/26/2010. FINDINGS: Focal left basilar airspace disease is identified. Right lung is clear. Heart size is mildly enlarged. No pneumothorax or pleural effusion. Aortic atherosclerosis is seen. No acute bony abnormality. IMPRESSION: Focal left basilar airspace disease worrisome for pneumonia. Cardiomegaly without edema. Atherosclerosis. Electronically Signed   By: Drusilla Kanner M.D.   On: 05/16/2016 09:09    EKG:   Orders placed or performed during the hospital encounter of 05/16/16  . ED EKG  . ED EKG    ASSESSMENT AND PLAN:   83 year old female with past medical history significant for Addison's disease, diabetes, hypertension, arthritis and hyperlipidemia presents from home secondary to cough and generalized weakness.  #1 acute hypoxic respiratory failure-secondary to influenza a and also superimposed left basilar pneumonia. -Patient not on home oxygen. Currently requiring 5 L oxygen. -Wean as tolerated. Continue Tamiflu. -Blood cultures are pending. Continue Levaquin -Cough medications have been adjusted.  #2 hyponatremia-dehydration and also secondary to influenza illness. -Gentle IV hydration and monitor.  #3 acute renal failure-has known CK D, baseline creatinine around 1.3-1.5. Creatinine worse secondary to ATN from infection. -Avoid nephrotoxins. Levaquin to be renally dosed. -Gentle hydration and follow-up.  #4 Addison's disease-continue hydrocortisone. Blood pressure is low normal, discontinue Norvasc and decrease metoprolol dosage.  #5 constipation-medications have been added.  #6 DVT prophylaxis-on Lovenox   Physical therapy consulted. Patient ambulates with cane at baseline.   All the records are reviewed and case discussed with Care Management/Social Workerr. Management plans discussed with the patient, family and they are in agreement.  CODE STATUS: Full code  TOTAL TIME TAKING CARE OF THIS PATIENT: 38  minutes.   POSSIBLE D/C IN 2 DAYS, DEPENDING ON CLINICAL CONDITION.   Enid Baas M.D on 05/17/2016 at 9:25 AM  Between 7am to 6pm - Pager - [phone]  After 6pm go to [email or web address] - Social research officer, government  [facility]  Office  [phone]  CC: Primary care physician; No PCP Per Patient

## 2016-05-18 ENCOUNTER — Inpatient Hospital Stay
Admit: 2016-05-18 | Discharge: 2016-05-18 | Disposition: A | Discharge status: Home / Self Care | Payer: Medicare Other | Attending: Infectious Diseases | Admitting: Infectious Diseases

## 2016-05-18 LAB — BASIC METABOLIC PANEL
ANION GAP: 8 (ref 5–15)
BUN: 41 mg/dL — ABNORMAL HIGH (ref 6–20)
CALCIUM: 8.1 mg/dL — AB (ref 8.9–10.3)
CO2: 17 mmol/L — AB (ref 22–32)
Chloride: 105 mmol/L (ref 101–111)
Creatinine, Ser: 1.75 mg/dL — ABNORMAL HIGH (ref 0.44–1.00)
GFR, EST AFRICAN AMERICAN: 29 mL/min — AB (ref >60)
GFR, EST NON AFRICAN AMERICAN: 25 mL/min — AB (ref >60)
GLUCOSE: 85 mg/dL (ref 65–99)
POTASSIUM: 4.8 mmol/L (ref 3.5–5.1)
Sodium: 130 mmol/L — ABNORMAL LOW (ref 135–145)

## 2016-05-18 LAB — GLUCOSE, CAPILLARY
GLUCOSE-CAPILLARY: 80 mg/dL (ref 65–99)
GLUCOSE-CAPILLARY: 123 mg/dL — AB (ref 65–99)
Glucose-Capillary: 146 mg/dL — ABNORMAL HIGH (ref 65–99)
Glucose-Capillary: 150 mg/dL — ABNORMAL HIGH (ref 65–99)

## 2016-05-18 LAB — CBC
HEMATOCRIT: 30.5 % — AB (ref 35.0–47.0)
HEMOGLOBIN: 9.9 g/dL — AB (ref 12.0–16.0)
MCH: 26.4 pg (ref 26.0–34.0)
MCHC: 32.5 g/dL (ref 32.0–36.0)
MCV: 81.2 fL (ref 80.0–100.0)
Platelets: 421 10*3/uL (ref 150–440)
RBC: 3.76 MIL/uL — AB (ref 3.80–5.20)
RDW: 16.4 % — ABNORMAL HIGH (ref 11.5–14.5)
WBC: 26.3 10*3/uL — ABNORMAL HIGH (ref 3.6–11.0)

## 2016-05-18 MED ORDER — BENZONATATE 100 MG PO CAPS
200.0000 mg | ORAL_CAPSULE | Freq: Three times a day (TID) | ORAL | Status: AC
  Administered 2016-05-18 – 2016-05-19 (×6): 200 mg via ORAL
  Filled 2016-05-18 (×5): qty 2

## 2016-05-18 MED ORDER — DEXTROSE 5 % IV SOLN: 2.0000 g | INTRAVENOUS | Status: DC

## 2016-05-18 MED ORDER — AZITHROMYCIN 250 MG PO TABS
500.0000 mg | ORAL_TABLET | Freq: Every day | ORAL | Status: DC
  Administered 2016-05-18 – 2016-05-19 (×2): 500 mg via ORAL
  Filled 2016-05-18 (×2): qty 2

## 2016-05-18 MED ORDER — ALPRAZOLAM 0.25 MG PO TABS
0.2500 mg | ORAL_TABLET | Freq: Three times a day (TID) | ORAL | Status: DC | PRN
  Administered 2016-05-19 (×2): 0.25 mg via ORAL
  Filled 2016-05-18 (×2): qty 1

## 2016-05-18 MED ORDER — CEFTRIAXONE SODIUM-DEXTROSE 2-2.22 GM-% IV SOLR
2.0000 g | INTRAVENOUS | Status: DC
  Administered 2016-05-18: 2 g via INTRAVENOUS
  Filled 2016-05-18 (×2): qty 50

## 2016-05-18 NOTE — Progress Notes (Signed)
Initial Nutrition Assessment  DOCUMENTATION CODES:   Non-severe (moderate) malnutrition in context of acute illness/injury  INTERVENTION:  1. Boost Breeze po TID, each supplement provides 250 kcal and 9 grams of protein 2. Snacks ordered (Jello)  NUTRITION DIAGNOSIS:   Malnutrition related to acute illness as evidenced by energy intake < or equal to 50% for > or equal to 5 days, moderate depletion of body fat, mild fluid accumulation.  GOAL:   Patient will meet greater than or equal to 90% of their needs  MONITOR:   Labs, Weight trends, I & O's, PO intake, Supplement acceptance  REASON FOR ASSESSMENT:   Malnutrition Screening Tool    ASSESSMENT:   Jodi Best  is a 83 y.o. female with a known history of Addison's disease, cataracts, diabetes type 2, essential hypertension, hyperlipidemia, osteoarthritis who is presenting with complaint of feeling weak and fatigue and now short of breath  Spoke with patient's daughter at bedside. Reports poor appetite for 2.5 weeks --> Daughter states patient was consuming jello and won ton soup for 1 week to try and get some PO intake - was not meeting her needs. She did not eat much for breakfast. Only ordered mashed potatoes w/ gravy for lunch, patient was not hungry, complains of no appetite, no interest in food. Patient appears to have chronic edema in her lower extremities. Unsure of actual weight. Chart review indicates fluctuations from 175-184# now down to 165# Nutrition-Focused physical exam completed. Findings are moderate fat depletion, moderate muscle depletion, and mild-moderate edema.  Labs and medications reviewed: CBGs 123-147, Na 130 Vitamin D, Senokot-S NS @ 4960mL/hr  Diet Order:  Diet regular Room service appropriate? Yes; Fluid consistency: Thin  Skin:  Wound (see comment) (Stage I to Sacrum)  Last BM:  05/09/2016  Height:   Ht Readings from Last 1 Encounters:  05/16/16 5\' 3"  (1.6 m)    Weight:   Wt Readings  from Last 1 Encounters:  05/16/16 165 lb (74.8 kg)    Ideal Body Weight:  52.27 kg  BMI:  Body mass index is 29.23 kg/m.  Estimated Nutritional Needs:   Kcal:  1300-1500 calories (MSJ x1.1-1.2)  Protein:  75-90 grams  Fluid:  >/= 1.3L  EDUCATION NEEDS:   No education needs identified at this time  Dionne AnoWilliam M. Ransom Nickson, MS, RD LDN Inpatient Clinical Dietitian Pager [phone]

## 2016-05-18 NOTE — Progress Notes (Addendum)
[facility] [place] [facility]   PATIENT NAME: Jodi Best    MR#:  [id number]  DATE OF BIRTH:  16-Jul-1928  SUBJECTIVE:  CHIEF COMPLAINT:   Chief Complaint  Patient presents with  . Flu-like symptoms   -Feels some better today, has worse cough today - o2 weaned to 2L this AM from 5L yesterday  REVIEW OF SYSTEMS:  Review of Systems  Constitutional: Positive for malaise/fatigue. Negative for chills and fever.  HENT: Positive for congestion. Negative for ear discharge, ear pain, hearing loss and nosebleeds.   Eyes: Negative for blurred vision and double vision.  Respiratory: Positive for cough and sputum production. Negative for shortness of breath and wheezing.   Cardiovascular: Negative for chest pain, palpitations and leg swelling.  Gastrointestinal: Negative for abdominal pain, constipation, diarrhea, nausea and vomiting.       Decreased appetite  Genitourinary: Negative for dysuria.  Musculoskeletal: Negative for myalgias.  Neurological: Positive for weakness. Negative for dizziness, speech change, focal weakness, seizures and headaches.  Psychiatric/Behavioral: Negative for depression.    DRUG ALLERGIES:   Allergies  Allergen Reactions  . Codeine     unlnown  . Penicillins     Has patient had a PCN reaction causing immediate rash, facial/tongue/throat swelling, SOB or lightheadedness with hypotension: no Has patient had a PCN reaction causing severe rash involving mucus membranes or skin necrosis: no Has patient had a PCN reaction that required hospitalization no Has patient had a PCN reaction occurring within the last 10 years: yes If all of the above answers are "NO", then may proceed with Cephalosporin use.     VITALS:  Blood pressure (!) 137/48, pulse 77, temperature 98 F (36.7 C), temperature source Oral, resp. rate 20, height 5\' 3"  (1.6 m), weight 74.8 kg (165 lb), SpO2 93 %.  PHYSICAL EXAMINATION:  Physical Exam  GENERAL:  83  y.o.-year-old patient lying in the bed with no acute distress.  EYES: Pupils equal, round, reactive to light and accommodation. No scleral icterus. Extraocular muscles intact.  HEENT: Head atraumatic, normocephalic. Oropharynx and nasopharynx clear.  NECK:  Supple, no jugular venous distention. No thyroid enlargement, no tenderness.  LUNGS: Normal breath sounds bilaterally, no wheezing, rales or crepitation. No use of accessory muscles of respiration. Bibasilar rhonchi heard CARDIOVASCULAR: S1, S2 normal. No  rubs, or gallops. 2/6 systolic murmur present ABDOMEN: Soft, nontender, nondistended. Bowel sounds present. No organomegaly or mass.  EXTREMITIES: No pedal edema, cyanosis, or clubbing.  NEUROLOGIC: Cranial nerves II through XII are intact. Muscle strength 5/5 in all extremities. Sensation intact. Gait not checked. Global  weakness noted PSYCHIATRIC: The patient is alert and oriented x 3.  SKIN: No obvious rash, lesion, or ulcer.    LABORATORY PANEL:   CBC  Recent Labs Lab 05/18/16 0630  WBC 26.3*  HGB 9.9*  HCT 30.5*  PLT 421   ------------------------------------------------------------------------------------------------------------------  Chemistries   Recent Labs Lab 05/16/16 0909  05/18/16 0630  NA 128*  < > 130*  K 4.7  < > 4.8  CL 96*  < > 105  CO2 19*  < > 17*  GLUCOSE 148*  < > 85  BUN 59*  < > 41*  CREATININE 2.75*  < > 1.75*  CALCIUM 8.9  < > 8.1*  AST 22  --   --   ALT 11*  --   --   ALKPHOS 92  --   --   BILITOT 1.0  --   --   < > =  values in this interval not displayed. ------------------------------------------------------------------------------------------------------------------  Cardiac Enzymes  Recent Labs Lab 05/16/16 0909  TROPONINI 0.05*   ------------------------------------------------------------------------------------------------------------------  RADIOLOGY:  No results found.  EKG:   Orders placed or performed during  the hospital encounter of 05/16/16  . ED EKG  . ED EKG    ASSESSMENT AND PLAN:   83 year old female with past medical history significant for Addison's disease, diabetes, hypertension, arthritis and hyperlipidemia presents from home secondary to cough and generalized weakness.  #1 acute hypoxic respiratory failure-secondary to influenza a and also superimposed left basilar pneumonia. -Patient not on home oxygen. requiring 5 L oxygen. Wean as tolerated- weaned to 2-3L  -Continue Tamiflu. -Blood cultures are growing staph bacteremia- change levaquin to rocephin and azithromycin.  -Cough medications have been adjusted.  #2 Sepsis- secondary to pneumonia, has bacteremia as well - ABX adjusted, f/u wbc count  #2 hyponatremia-dehydration and also secondary to influenza illness. -Gentle IV hydration and monitor.  #3 acute renal failure-has known CKD, baseline creatinine around 1.3-1.5. Creatinine worse secondary to ATN from infection. -Avoid nephrotoxins.  -Gentle hydration and Improving now  #4 Addison's disease-continue hydrocortisone. Blood pressure is low normal, discontinue Norvasc and decrease metoprolol dosage.  #5 constipation-medications have been added.  #6 DVT prophylaxis-on Lovenox   Physical therapy consulted. Patient ambulates with cane at baseline.   All the records are reviewed and case discussed with Care Management/Social Workerr. Management plans discussed with the patient, family and they are in agreement.  CODE STATUS: Full code  TOTAL TIME TAKING CARE OF THIS PATIENT: 36 minutes.   POSSIBLE D/C IN 1-2 DAYS, DEPENDING ON CLINICAL CONDITION.   Enid Baas M.D on 05/18/2016 at 9:16 AM  Between 7am to 6pm - Pager - [phone]  After 6pm go to [email or web address] - Social research officer, government  [facility]  Office  [phone]  CC: Primary care physician; No PCP Per Patient

## 2016-05-18 NOTE — Progress Notes (Signed)
*  PRELIMINARY RESULTS* Echocardiogram 2D Echocardiogram has been performed.  Garrel Ridgelikeshia S Zooey Schreurs 05/18/2016, 4:36 PM

## 2016-05-18 NOTE — Progress Notes (Addendum)
IDE note Recevied notification of MSSA + BCX. On levo for PNA  83 yo ill for about 1-2 weeks admitted with fevers, weakness. Flu test +. CXR with L base infiltrate. BCX + MSSA.  Sputum cx + Staph as well  She is PCN allergic but has started on ceftriaxone and azithro  Repeat bcx WIll need TTE

## 2016-05-18 NOTE — Plan of Care (Signed)
Problem: Education: Goal: Knowledge of [facility] Education information/materials will improve Outcome: Not Progressing Patient needs family assistance.  Problem: Health Behavior/Discharge Planning: Goal: Ability to manage health-related needs will improve Outcome: Not Progressing Patient needs family assistance.  Problem: Activity: Goal: Risk for activity intolerance will decrease Outcome: Not Progressing Patient has weakness and would benefit from PT.  Problem: Fluid Volume: Goal: Ability to maintain a balanced intake and output will improve Outcome: Not Progressing Patient has a poor appetite and needs encouragement to eat.  Problem: Nutrition: Goal: Adequate nutrition will be maintained Outcome: Not Progressing Poor appetite.  Problem: Bowel/Gastric: Goal: Will not experience complications related to bowel motility Outcome: Progressing Patient is on laxatives to encourage bowel motility.

## 2016-05-18 NOTE — NC FL2 (Signed)
[place] MEDICAID FL2 LEVEL OF CARE SCREENING TOOL     IDENTIFICATION  Patient Name: Jodi Best Birthdate: [birth year] Sex: female Admission Date (Current Location): 05/16/2016  [place] and [place] Number:  Chiropodist and Address:  [facility], [address], [place], [place] [address]      Provider Number: [id number]  Attending Physician Name and Address:  Enid Baas, MD  Relative Name and Phone Number:       Current Level of Care: Hospital Recommended Level of Care: Skilled Nursing Facility Prior Approval Number:    Date Approved/Denied: 05/18/16 PASRR Number: [id number]  Discharge Plan: SNF    Current Diagnoses: Patient Active Problem List   Diagnosis Date Noted  . Pressure injury of skin 05/17/2016  . PNA (pneumonia) 05/16/2016    Orientation RESPIRATION BLADDER Height & Weight     Self, Time, Situation, Place  O2 (o2 4L acute) Continent Weight: 165 lb (74.8 kg) Height:  5\' 3"  (160 cm)  BEHAVIORAL SYMPTOMS/MOOD NEUROLOGICAL BOWEL NUTRITION STATUS      Continent Supplemental (Boost Breeze due to disease related malnutrition)  AMBULATORY STATUS COMMUNICATION OF NEEDS Skin   Extensive Assist Verbally Surgical wounds                       Personal Care Assistance Level of Assistance  Bathing, Dressing Bathing Assistance: Limited assistance   Dressing Assistance: Limited assistance     Functional Limitations Info  Sight Sight Info: Impaired (Cataracts)        SPECIAL CARE FACTORS FREQUENCY  PT (By licensed PT)     PT Frequency: Up to 5X per day, 5 days per week              Contractures Contractures Info: Present    Additional Factors Info  Allergies   Allergies Info: Codeine, Penicillins           Current Medications (05/18/2016):  This is the current hospital active medication list Current Facility-Administered Medications  Medication Dose Route Frequency Provider Last Rate Last  Dose  . 0.9 %  sodium chloride infusion   Intravenous Continuous Enid Baas, MD 60 mL/hr at 05/18/16 0939    . acetaminophen (TYLENOL) tablet 650 mg  650 mg Oral Q6H PRN Auburn Bilberry, MD       Or  . acetaminophen (TYLENOL) suppository 650 mg  650 mg Rectal Q6H PRN Auburn Bilberry, MD      . ALPRAZolam Prudy Feeler) tablet 0.25 mg  0.25 mg Oral TID PRN Enid Baas, MD      . aspirin EC tablet 81 mg  81 mg Oral Daily Auburn Bilberry, MD   81 mg at 05/18/16 0941  . azithromycin (ZITHROMAX) tablet 500 mg  500 mg Oral Daily Enid Baas, MD   500 mg at 05/18/16 0953  . benzonatate (TESSALON) capsule 200 mg  200 mg Oral TID Enid Baas, MD   200 mg at 05/18/16 1506  . calcium carbonate (TUMS - dosed in mg elemental calcium) chewable tablet 1,500 mg  1,500 mg Oral BID WC Auburn Bilberry, MD   1,500 mg at 05/18/16 1713  . cefTRIAXone (ROCEPHIN) IVPB 2 g  2 g Intravenous Q24H Jodelle Red Alto, RPH   2 g at 05/18/16 5621  . cholecalciferol (VITAMIN D) tablet 1,000 Units  1,000 Units Oral Daily Auburn Bilberry, MD   1,000 Units at 05/18/16 0941  . guaiFENesin (MUCINEX) 12 hr tablet 600 mg  600 mg Oral BID  Auburn BilberryShreyang Patel, MD   600 mg at 05/18/16 0943   And  . dextromethorphan (DELSYM) 30 MG/5ML liquid 30 mg  30 mg Oral BID Auburn BilberryShreyang Patel, MD   30 mg at 05/18/16 0941  . fluticasone (FLONASE) 50 MCG/ACT nasal spray 2 spray  2 spray Each Nare QHS Auburn BilberryShreyang Patel, MD   2 spray at 05/17/16 2115  . heparin injection 5,000 Units  5,000 Units Subcutaneous Q8H Auburn BilberryShreyang Patel, MD   5,000 Units at 05/18/16 1337  . hydrocortisone (CORTEF) tablet 20 mg  20 mg Oral Daily Auburn BilberryShreyang Patel, MD   20 mg at 05/18/16 0940  . insulin aspart (novoLOG) injection 0-9 Units  0-9 Units Subcutaneous TID WC Auburn BilberryShreyang Patel, MD   1 Units at 05/18/16 1714  . loratadine (CLARITIN) tablet 10 mg  10 mg Oral Daily Auburn BilberryShreyang Patel, MD   10 mg at 05/18/16 0941  . MEDLINE mouth rinse  15 mL Mouth Rinse BID Auburn BilberryShreyang Patel, MD   15 mL at  05/18/16 1000  . metoprolol tartrate (LOPRESSOR) tablet 12.5 mg  12.5 mg Oral BID Enid Baasadhika [name], MD   12.5 mg at 05/18/16 0941  . ondansetron (ZOFRAN) tablet 4 mg  4 mg Oral Q6H PRN Auburn BilberryShreyang Patel, MD       Or  . ondansetron (ZOFRAN) injection 4 mg  4 mg Intravenous Q6H PRN Auburn BilberryShreyang Patel, MD      . oseltamivir (TAMIFLU) capsule 30 mg  30 mg Oral Daily Auburn BilberryShreyang Patel, MD   30 mg at 05/18/16 0942  . polyethylene glycol (MIRALAX / GLYCOLAX) packet 17 g  17 g Oral Daily PRN Enid Baasadhika [name], MD   17 g at 05/17/16 1019  . pravastatin (PRAVACHOL) tablet 20 mg  20 mg Oral q1800 Auburn BilberryShreyang Patel, MD   20 mg at 05/18/16 1714  . senna-docusate (Senokot-S) tablet 2 tablet  2 tablet Oral Daily Enid Baasadhika [name], MD   2 tablet at 05/18/16 0941  . Travoprost (BAK Free) (TRAVATAN) 0.004 % ophthalmic solution SOLN 1 drop  1 drop Both Eyes QHS Auburn BilberryShreyang Patel, MD   1 drop at 05/17/16 2115  . valACYclovir (VALTREX) tablet 1,000 mg  1,000 mg Oral Daily Auburn BilberryShreyang Patel, MD   1,000 mg at 05/18/16 [id number]     Discharge Medications: Please see discharge summary for a list of discharge medications.  Relevant Imaging Results:  Relevant Lab Results:   Additional Information SS# [id number]  Judi CongKaren M Wadell Craddock, LCSW

## 2016-05-18 NOTE — Evaluation (Signed)
Physical Therapy Evaluation Patient Details Name: Jodi Best MRN: [id number] DOB: 09/05/1928 Today's Date: 05/18/2016   History of Present Illness  Pt admitted for complaints of weakness and SOB. Pt now diagnosed with flu and pneumonia. Pt with history of addison's dx, DM, HTN, and OA. Pt currently on droplet isolation at this time.  Clinical Impression  Pt is a pleasant 83 year old female who was admitted for Flu and pneumonia. Pt performs bed mobility with min assist, transfers with mod assist, and ambulation with min assist and RW. Pt demonstrates deficits with strength/endurance/mobility/safety awareness. Pt on 4L of O2 and fatigues with limited exertion. Pt agreeable to sit in chair for breakfast. High risk for falls at this time, not at baseline level. Would benefit from skilled PT to address above deficits and promote optimal return to PLOF; recommend transition to STR upon discharge from acute hospitalization.       Follow Up Recommendations SNF    Equipment Recommendations  Rolling walker with 5" wheels    Recommendations for Other Services       Precautions / Restrictions Precautions Precautions: Fall Restrictions Weight Bearing Restrictions: No      Mobility  Bed Mobility Overal bed mobility: Needs Assistance Bed Mobility: Supine to Sit     Supine to sit: Min assist     General bed mobility comments: assist for sliding B LE off bed as well as assist for upper body. Pt reaches out to bed rail for assistance. Once seated at EOB, pt impulsive and tries to stand prior to therapist cues.  Transfers Overall transfer level: Needs assistance Equipment used: Rolling walker (2 wheeled) Transfers: Sit to/from Stand Sit to Stand: Mod assist         General transfer comment: assist for upright posture, decreased safety awareness noted. RW used for assistance.   Ambulation/Gait Ambulation/Gait assistance: Min assist Ambulation Distance (Feet): 5 Feet Assistive  device: Rolling walker (2 wheeled) Gait Pattern/deviations: Step-to pattern     General Gait Details: ambulated to recliner with impulsive technique with therapist providing cues for safety with lines/leads. Pt with forward flexed posture and uses RW for assistance. All mobility performed on 4L of O2 with sats at 93% pre and decreased to 85% with minimal exertion. Takes 2 min seated rest break for return to 89%.  Stairs            Wheelchair Mobility    Modified Rankin (Stroke Patients Only)       Balance Overall balance assessment: Needs assistance Sitting-balance support: Feet supported Sitting balance-Leahy Scale: Good     Standing balance support: Bilateral upper extremity supported Standing balance-Leahy Scale: Fair                               Pertinent Vitals/Pain Pain Assessment: No/denies pain    Home Living Family/patient expects to be discharged to:: Private residence Living Arrangements: Children Available Help at Discharge: Family Type of Home: House Home Access: Level entry       Home Equipment: Cane - single point      Prior Function Level of Independence: Independent with assistive device(s)         Comments: Pt ambulates household distances with SPC at baseline.     Hand Dominance        Extremity/Trunk Assessment   Upper Extremity Assessment Upper Extremity Assessment: Generalized weakness (B UE grossly 4/5)    Lower Extremity Assessment Lower Extremity  Assessment: Generalized weakness (B LE grossly 3+/5)       Communication   Communication: No difficulties  Cognition Arousal/Alertness: Awake/alert Behavior During Therapy: Flat affect Overall Cognitive Status: Within Functional Limits for tasks assessed                      General Comments      Exercises Other Exercises Other Exercises: Supine ther-ex performed on B LE including ankle pumps, SLRs, hip abd/add, and heel slides. All ther-ex  performed x 10 reps with min assist for technique. Slight SOB symptoms noted with all mobility.   Assessment/Plan    PT Assessment Patient needs continued PT services  PT Problem List Decreased strength;Decreased activity tolerance;Decreased balance;Decreased mobility;Decreased knowledge of use of DME;Cardiopulmonary status limiting activity          PT Treatment Interventions DME instruction;Gait training;Therapeutic exercise    PT Goals (Current goals can be found in the Care Plan section)  Acute Rehab PT Goals Patient Stated Goal: to feel better PT Goal Formulation: With patient Time For Goal Achievement: 06/01/16 Potential to Achieve Goals: Good    Frequency Min 2X/week   Barriers to discharge        Co-evaluation               End of Session Equipment Utilized During Treatment: Gait belt;Oxygen Activity Tolerance: Patient limited by fatigue Patient left: in chair (no chair alarm available; RN notified ) Nurse Communication: Mobility status         Time: 1610-9604 PT Time Calculation (min) (ACUTE ONLY): 23 min   Charges:   PT Evaluation $PT Eval Moderate Complexity: 1 Procedure PT Treatments $Therapeutic Exercise: 8-22 mins   PT G Codes:        Melvie Paglia 2016/05/31, 11:25 AM  Elizabeth Palau, PT, DPT [phone]

## 2016-05-18 NOTE — Progress Notes (Signed)
05/18/2016  11:48 AM  Pt repeatedly states "I want to go on" and "I'm ready to go".  Attempted many times to reassure pt that she is not currently dying.  Vital signs stable, nothing else unexpected of note.  Will continue to comfort and reassure pt that death is not imminent.  Bradly Chrisougherty, Murdis Flitton E, RN

## 2016-05-18 NOTE — Plan of Care (Signed)
Problem: Activity: Goal: Risk for activity intolerance will decrease Outcome: Progressing Pt got up to the chair with PT today  Problem: Nutrition: Goal: Adequate nutrition will be maintained Outcome: Not Progressing Pt eats very little of her meals today, insisting she is finished after only a few bites.  Bradly Chrisougherty, Sanna Porcaro E, RN

## 2016-05-18 NOTE — Progress Notes (Signed)
05/18/2016  12:06 PM  Spoke with attending MD Nemiah CommanderKalisetti about pt's continued statements of readiness to die.  Her opinion pt suffering from depression and possible anxiety.  Ordered PRN Xanax.  Will continue to monitor assess, and administer as needed.  Bradly Chrisougherty, Jerzey Komperda E, RN

## 2016-05-19 LAB — BASIC METABOLIC PANEL
Anion gap: 9 (ref 5–15)
BUN: 29 mg/dL — AB (ref 6–20)
CHLORIDE: 105 mmol/L (ref 101–111)
CO2: 17 mmol/L — ABNORMAL LOW (ref 22–32)
Calcium: 8 mg/dL — ABNORMAL LOW (ref 8.9–10.3)
Creatinine, Ser: 1.39 mg/dL — ABNORMAL HIGH (ref 0.44–1.00)
GFR calc Af Amer: 39 mL/min — ABNORMAL LOW (ref >60)
GFR calc non Af Amer: 34 mL/min — ABNORMAL LOW (ref >60)
Glucose, Bld: 79 mg/dL (ref 65–99)
POTASSIUM: 4.7 mmol/L (ref 3.5–5.1)
SODIUM: 131 mmol/L — AB (ref 135–145)

## 2016-05-19 LAB — CBC
HCT: 27.3 % — ABNORMAL LOW (ref 35.0–47.0)
HEMOGLOBIN: 9.2 g/dL — AB (ref 12.0–16.0)
MCH: 27.4 pg (ref 26.0–34.0)
MCHC: 33.5 g/dL (ref 32.0–36.0)
MCV: 81.8 fL (ref 80.0–100.0)
Platelets: 365 10*3/uL (ref 150–440)
RBC: 3.34 MIL/uL — AB (ref 3.80–5.20)
RDW: 16.5 % — ABNORMAL HIGH (ref 11.5–14.5)
WBC: 21.5 10*3/uL — ABNORMAL HIGH (ref 3.6–11.0)

## 2016-05-19 LAB — GLUCOSE, CAPILLARY
GLUCOSE-CAPILLARY: 90 mg/dL (ref 65–99)
GLUCOSE-CAPILLARY: 147 mg/dL — AB (ref 65–99)
Glucose-Capillary: 91 mg/dL (ref 65–99)
Glucose-Capillary: 171 mg/dL — ABNORMAL HIGH (ref 65–99)

## 2016-05-19 LAB — CULTURE, RESPIRATORY

## 2016-05-19 LAB — CULTURE, RESPIRATORY W GRAM STAIN: Special Requests: NORMAL

## 2016-05-19 LAB — ECHOCARDIOGRAM COMPLETE
HEIGHTINCHES: 63 in
Weight: 2640 oz

## 2016-05-19 MED ORDER — CEFAZOLIN IN D5W 1 GM/50ML IV SOLN
1.0000 g | Freq: Two times a day (BID) | INTRAVENOUS | Status: DC
  Administered 2016-05-19 – 2016-05-21 (×6): 1 g via INTRAVENOUS
  Filled 2016-05-19 (×8): qty 50

## 2016-05-19 MED ORDER — SODIUM CHLORIDE 0.9 % IV SOLN
INTRAVENOUS | Status: AC
  Administered 2016-05-19 – 2016-05-20 (×2): via INTRAVENOUS

## 2016-05-19 MED ORDER — CITALOPRAM HYDROBROMIDE 10 MG PO TABS
10.0000 mg | ORAL_TABLET | Freq: Every day | ORAL | Status: DC
  Administered 2016-05-19 – 2016-05-22 (×4): 10 mg via ORAL
  Filled 2016-05-19 (×5): qty 1

## 2016-05-19 NOTE — Progress Notes (Signed)
Pharmacy Antibiotic Note  Jodi Best is a 83 y.o. female admitted on 05/16/2016 with bacteremia/pneumonia (MSSA).  Pharmacy has been consulted for cefazolin dosing.  Plan: Cefazolin 1 g IV q12h (renally adjusted dosing).   Height: 5\' 3"  (160 cm) Weight: 165 lb (74.8 kg) IBW/kg (Calculated) : 52.4  Temp (24hrs), Avg:98.3 F (36.8 C), Min:98 F (36.7 C), Max:98.6 F (37 C)   Recent Labs Lab 05/16/16 0909 05/17/16 0523 05/18/16 0630 05/19/16 0445  WBC 17.8* 25.0* 26.3* 21.5*  CREATININE 2.75* 2.95* 1.75* 1.39*    Estimated Creatinine Clearance: 28.7 mL/min (by C-G formula based on SCr of 1.39 mg/dL (H)).    Allergies  Allergen Reactions  . Codeine     unlnown  . Penicillins     Has patient had a PCN reaction causing immediate rash, facial/tongue/throat swelling, SOB or lightheadedness with hypotension: no Has patient had a PCN reaction causing severe rash involving mucus membranes or skin necrosis: no Has patient had a PCN reaction that required hospitalization no Has patient had a PCN reaction occurring within the last 10 years: yes If all of the above answers are "NO", then may proceed with Cephalosporin use.     Antimicrobials this admission: levaquin 1/12 x1 Azithro 1/14 >>1/15 Ceftriaxone 1/14 >> 1/15 Ancef 1/15 >>  Dose adjustments this admission:   Microbiology results: 1/12 BCx: 1/2 MSSA 1/14 BCx: NGTD Sputum: MSSA   Thank you for allowing pharmacy to be a part of this patient's care.  Marty HeckWang, Joon Pohle L 05/19/2016 2:01 PM

## 2016-05-19 NOTE — Progress Notes (Signed)
PT Cancellation Note  Patient Details Name: Jodi Best MRN: [id number] DOB: 07-09-1928   Cancelled Treatment:    Reason Eval/Treat Not Completed: Medical issues which prohibited therapy   Pt in bed, reported feeling "bad" but willing to attempt therapy this am.  While participating in ankle pumps, O2 sats were checked.  HR noted to be high and fluctuating between 110's to 140's with rest.  O2 sats mid to high 90's on 3 llpm.  Session held and primary nurse notified.  Will continue as appropriate.   Danielle DessSarah Javeah Loeza 05/19/2016, 11:34 AM

## 2016-05-19 NOTE — Progress Notes (Signed)
Infectious Disease Long Term IV Antibiotic Orders  Diagnosis: MSSA bacteremia with PNA and Influenza  Culture results MSSA   Allergies:  Allergies  Allergen Reactions  . Codeine     unlnown  . Penicillins     Has patient had a PCN reaction causing immediate rash, facial/tongue/throat swelling, SOB or lightheadedness with hypotension: no Has patient had a PCN reaction causing severe rash involving mucus membranes or skin necrosis: no Has patient had a PCN reaction that required hospitalization no Has patient had a PCN reaction occurring within the last 10 years: yes If all of the above answers are "NO", then may proceed with Cephalosporin use.     Discharge antibiotics Cefazolin      2 grams every  8 hours  PICC Care per protocol Labs weekly while on IV antibiotics      CBC w diff   Comprehensive met panel  Planned duration of antibiotics 2 weeks from 05/19/15  Stop date 06/02/15 Follow up clinic date - within 2 weeks  FAX weekly labs to [phone]  Leonel Ramsay, MD

## 2016-05-19 NOTE — Progress Notes (Signed)
[facility] [place] [facility]   PATIENT NAME: Jodi Best    MR#:  [id number]  DATE OF BIRTH:  1928/05/11  SUBJECTIVE:  CHIEF COMPLAINT:   Chief Complaint  Patient presents with  . Flu-like symptoms   - better today clinically, on 3L o2 - feels very depressed, wanting to 'die' just because she is sick - PT recommended rehab  REVIEW OF SYSTEMS:  Review of Systems  Constitutional: Positive for malaise/fatigue. Negative for chills and fever.  HENT: Positive for congestion. Negative for ear discharge, ear pain, hearing loss and nosebleeds.   Eyes: Negative for blurred vision and double vision.  Respiratory: Positive for cough and sputum production. Negative for shortness of breath and wheezing.   Cardiovascular: Negative for chest pain, palpitations and leg swelling.  Gastrointestinal: Negative for abdominal pain, constipation, diarrhea, nausea and vomiting.       Decreased appetite  Genitourinary: Negative for dysuria.  Musculoskeletal: Negative for myalgias.  Neurological: Positive for weakness. Negative for dizziness, speech change, focal weakness, seizures and headaches.  Psychiatric/Behavioral: Positive for depression.    DRUG ALLERGIES:   Allergies  Allergen Reactions  . Codeine     unlnown  . Penicillins     Has patient had a PCN reaction causing immediate rash, facial/tongue/throat swelling, SOB or lightheadedness with hypotension: no Has patient had a PCN reaction causing severe rash involving mucus membranes or skin necrosis: no Has patient had a PCN reaction that required hospitalization no Has patient had a PCN reaction occurring within the last 10 years: yes If all of the above answers are "NO", then may proceed with Cephalosporin use.     VITALS:  Blood pressure (!) 138/54, pulse 92, temperature 98.6 F (37 C), temperature source Oral, resp. rate 16, height 5\' 3"  (1.6 m), weight 74.8 kg (165 lb), SpO2 94 %.  PHYSICAL  EXAMINATION:  Physical Exam  GENERAL:  83 y.o.-year-old patient lying in the bed with no acute distress.  EYES: Pupils equal, round, reactive to light and accommodation. No scleral icterus. Extraocular muscles intact.  HEENT: Head atraumatic, normocephalic. Oropharynx and nasopharynx clear.  NECK:  Supple, no jugular venous distention. No thyroid enlargement, no tenderness.  LUNGS: Normal breath sounds bilaterally, no wheezing, rales or crepitation. No use of accessory muscles of respiration. Bibasilar rhonchi heard CARDIOVASCULAR: S1, S2 normal. No  rubs, or gallops. 2/6 systolic murmur present ABDOMEN: Soft, nontender, nondistended. Bowel sounds present. No organomegaly or mass.  EXTREMITIES: No pedal edema, cyanosis, or clubbing.  NEUROLOGIC: Cranial nerves II through XII are intact. Muscle strength 5/5 in all extremities. Sensation intact. Gait not checked. Global  weakness noted PSYCHIATRIC: The patient is alert and oriented x 3. Flat affect, depressed SKIN: No obvious rash, lesion, or ulcer.    LABORATORY PANEL:   CBC  Recent Labs Lab 05/19/16 0445  WBC 21.5*  HGB 9.2*  HCT 27.3*  PLT 365   ------------------------------------------------------------------------------------------------------------------  Chemistries   Recent Labs Lab 05/16/16 0909  05/19/16 0445  NA 128*  < > 131*  K 4.7  < > 4.7  CL 96*  < > 105  CO2 19*  < > 17*  GLUCOSE 148*  < > 79  BUN 59*  < > 29*  CREATININE 2.75*  < > 1.39*  CALCIUM 8.9  < > 8.0*  AST 22  --   --   ALT 11*  --   --   ALKPHOS 92  --   --   BILITOT  1.0  --   --   < > = values in this interval not displayed. ------------------------------------------------------------------------------------------------------------------  Cardiac Enzymes  Recent Labs Lab 05/16/16 0909  TROPONINI 0.05*   ------------------------------------------------------------------------------------------------------------------  RADIOLOGY:    No results found.  EKG:   Orders placed or performed during the hospital encounter of 05/16/16  . ED EKG  . ED EKG    ASSESSMENT AND PLAN:   83 year old female with past medical history significant for Addison's disease, diabetes, hypertension, arthritis and hyperlipidemia presents from home secondary to cough and generalized weakness.  #1 acute hypoxic respiratory failure- secondary to influenza a and also superimposed left basilar pneumonia. -Patient not on home oxygen. requiring 5 L oxygen on admisison. Wean as tolerated- weaned to 2-3L  - encourage incentive spirometry -Continue Tamiflu x 5 days. -Blood cultures are growing staph bacteremia- changed to rocephin- 2 weeks, can d/c azithromycin.  - ECHO negative for any vegetations -Cough medications have been adjusted. -  #2 Sepsis- secondary to pneumonia, has a staph aureus bacteremia - on rocephin - ABX adjusted, f/u wbc count-improving now  #2 hyponatremia-dehydration and also secondary to influenza illness. -improving with iv fluids  #3 acute renal failure-has known CKD, baseline creatinine around 1.3-1.5. Creatinine worse secondary to ATN from infection on admission. -Avoid nephrotoxins.  -Improved with IV fluids and back to baseline now.  #4 Addison's disease-continue hydrocortisone. Blood pressure is low normal, so discontinue Norvasc and decrease metoprolol dosage for now.  #5 Depression- depressed from the illness, no active suicidal ideation. Start low-dose Celexa  #6 DVT prophylaxis-on Lovenox   Physical therapy consulted. Patient ambulates with cane at baseline. Recommended SNF- social worker aware Updated daughter over the phone   All the records are reviewed and case discussed with Care Management/Social Workerr. Management plans discussed with the patient, family and they are in agreement.  CODE STATUS: Full code  TOTAL TIME TAKING CARE OF THIS PATIENT: 36 minutes.   POSSIBLE D/C IN 1-2 DAYS,  DEPENDING ON CLINICAL CONDITION.   Enid BaasKALISETTI,Kendi Defalco M.D on 05/19/2016 at 11:36 AM  Between 7am to 6pm - Pager - [phone]  After 6pm go to [email or web address] - Social research officer, governmentpassword EPAS [facility]  [facility]  Office  [phone]  CC: Primary care physician; No PCP Per Patient

## 2016-05-19 NOTE — Consult Note (Signed)
[facility] Infectious Disease     Reason for Consult: Flu, MSSA bacteremia, PNA  Referring Physician:[name], R  Date of Admission:  05/16/2016   Active Problems:   PNA (pneumonia)   Pressure injury of skin   HPI: Jodi Best is a 83 y.o. female admitted with 1-2 weeks of illness- initially a GI illness with nv d. Then developed cough, sob, fevers. On admit Flu test +. CXR with L base infiltrate. BCX + MSSA.  Sputum cx + Staph as well She is PCN allergic but has started on ceftriaxone and azithro and feels a little stronger. No fevers. WBC peaked 26 down to 21 now.   Past Medical History:  Diagnosis Date  . Addison disease ([place])   . Cataract   . Diabetes mellitus without complication ([place])   . Hyperlipemia   . Hypertension   . Osteoarthritis    Past Surgical History:  Procedure Laterality Date  . CORNEAL TRANSPLANT    . EYE SURGERY    . LUMBAR LAMINECTOMY    . WRIST SURGERY     Social History  Substance Use Topics  . Smoking status: Never Smoker  . Smokeless tobacco: Never Used  . Alcohol use No   Family History  Problem Relation Age of Onset  . Hypertension Mother     Allergies:  Allergies  Allergen Reactions  . Codeine     unlnown  . Penicillins     Has patient had a PCN reaction causing immediate rash, facial/tongue/throat swelling, SOB or lightheadedness with hypotension: no Has patient had a PCN reaction causing severe rash involving mucus membranes or skin necrosis: no Has patient had a PCN reaction that required hospitalization no Has patient had a PCN reaction occurring within the last 10 years: yes If all of the above answers are "NO", then may proceed with Cephalosporin use.     Current antibiotics: Antibiotics Given (last 72 hours)    Date/Time Action Medication Dose Rate   05/16/16 1421 Given   valACYclovir (VALTREX) tablet 1,000 mg 1,000 mg    05/16/16 1422 Given   oseltamivir (TAMIFLU) capsule 30 mg 30 mg    05/17/16 1003 Given    valACYclovir (VALTREX) tablet 1,000 mg 1,000 mg    05/17/16 1004 Given   oseltamivir (TAMIFLU) capsule 30 mg 30 mg    05/18/16 0941 Given   valACYclovir (VALTREX) tablet 1,000 mg 1,000 mg    05/18/16 0942 Given   oseltamivir (TAMIFLU) capsule 30 mg 30 mg    05/18/16 0942 Given   cefTRIAXone (ROCEPHIN) IVPB 2 g 2 g 100 mL/hr   05/18/16 0953 Given   azithromycin (ZITHROMAX) tablet 500 mg 500 mg    05/19/16 1118 Given   oseltamivir (TAMIFLU) capsule 30 mg 30 mg    05/19/16 1118 Given   azithromycin (ZITHROMAX) tablet 500 mg 500 mg    05/19/16 1119 Given   valACYclovir (VALTREX) tablet 1,000 mg 1,000 mg       MEDICATIONS: . aspirin EC  81 mg Oral Daily  . benzonatate  200 mg Oral TID  . calcium carbonate  1,500 mg Oral BID WC  . cefTRIAXone  2 g Intravenous Q24H  . cholecalciferol  1,000 Units Oral Daily  . citalopram  10 mg Oral Daily  . guaiFENesin  600 mg Oral BID   And  . dextromethorphan  30 mg Oral BID  . fluticasone  2 spray Each Nare QHS  . heparin subcutaneous  5,000 Units Subcutaneous Q8H  . hydrocortisone  20 mg Oral Daily  . insulin aspart  0-9 Units Subcutaneous TID WC  . loratadine  10 mg Oral Daily  . mouth rinse  15 mL Mouth Rinse BID  . metoprolol  12.5 mg Oral BID  . oseltamivir  30 mg Oral Daily  . pravastatin  20 mg Oral q1800  . senna-docusate  2 tablet Oral Daily  . Travoprost (BAK Free)  1 drop Both Eyes QHS  . valACYclovir  1,000 mg Oral Daily    Review of Systems - 11 systems reviewed and negative per HPI   OBJECTIVE: Temp:  [98 F (36.7 C)-98.6 F (37 C)] 98.6 F (37 C) (01/15 0743) Pulse Rate:  [84-92] 92 (01/15 0743) Resp:  [16-20] 16 (01/15 0743) BP: (135-138)/(54) 138/54 (01/15 0743) SpO2:  [92 %-94 %] 94 % (01/15 0743) Physical Exam  Constitutional: frail,  HENT: [place]/AT, PERRLA, no scleral icterus Mouth/Throat: Oropharynx is clear and dry. No oropharyngeal exudate.  Cardiovascular: Normal rate, regular rhythm and normal heart  sounds.  Pulmonary/Chest: bil rhonchi Neck = supple, no nuchal rigidity Abdominal: Soft. Bowel sounds are normal.  exhibits no distension. There is no tenderness.  Lymphadenopathy: no cervical adenopathy. No axillary adenopathy Neurological: alert and oriented to person, place, and time.  Skin: Skin is warm and dry. No rash noted. No erythema.  Psychiatric: flat affect LABS: Results for orders placed or performed during the hospital encounter of 05/16/16 (from the past 48 hour(s))  Glucose, capillary     Status: Abnormal   Collection Time: 05/17/16  5:01 PM  Result Value Ref Range   Glucose-Capillary 147 (H) 65 - 99 mg/dL  Glucose, capillary     Status: Abnormal   Collection Time: 05/17/16  9:18 PM  Result Value Ref Range   Glucose-Capillary 143 (H) 65 - 99 mg/dL  Basic metabolic panel     Status: Abnormal   Collection Time: 05/18/16  6:30 AM  Result Value Ref Range   Sodium 130 (L) 135 - 145 mmol/L   Potassium 4.8 3.5 - 5.1 mmol/L   Chloride 105 101 - 111 mmol/L   CO2 17 (L) 22 - 32 mmol/L   Glucose, Bld 85 65 - 99 mg/dL   BUN 41 (H) 6 - 20 mg/dL   Creatinine, Ser 1.75 (H) 0.44 - 1.00 mg/dL   Calcium 8.1 (L) 8.9 - 10.3 mg/dL   GFR calc non Af Amer 25 (L) >60 mL/min   GFR calc Af Amer 29 (L) >60 mL/min    Comment: (NOTE) The eGFR has been calculated using the CKD EPI equation. This calculation has not been validated in all clinical situations. eGFR's persistently <60 mL/min signify possible Chronic Kidney Disease.    Anion gap 8 5 - 15  CBC     Status: Abnormal   Collection Time: 05/18/16  6:30 AM  Result Value Ref Range   WBC 26.3 (H) 3.6 - 11.0 K/uL   RBC 3.76 (L) 3.80 - 5.20 MIL/uL   Hemoglobin 9.9 (L) 12.0 - 16.0 g/dL   HCT 30.5 (L) 35.0 - 47.0 %   MCV 81.2 80.0 - 100.0 fL   MCH 26.4 26.0 - 34.0 pg   MCHC 32.5 32.0 - 36.0 g/dL   RDW 16.4 (H) 11.5 - 14.5 %   Platelets 421 150 - 440 K/uL  Glucose, capillary     Status: None   Collection Time: 05/18/16  7:24 AM   Result Value Ref Range   Glucose-Capillary 80 65 - 99 mg/dL     Comment 1 Notify RN   Glucose, capillary     Status: Abnormal   Collection Time: 05/18/16 11:36 AM  Result Value Ref Range   Glucose-Capillary 123 (H) 65 - 99 mg/dL  Culture, blood (single) w Reflex to ID Panel     Status: None (Preliminary result)   Collection Time: 05/18/16  1:05 PM  Result Value Ref Range   Specimen Description BLOOD LEFT HAND    Special Requests BOTTLES DRAWN AEROBIC AND ANAEROBIC ANA8ML AER7ML    Culture NO GROWTH < 24 HOURS    Report Status PENDING   Glucose, capillary     Status: Abnormal   Collection Time: 05/18/16  4:59 PM  Result Value Ref Range   Glucose-Capillary 146 (H) 65 - 99 mg/dL  Glucose, capillary     Status: Abnormal   Collection Time: 05/18/16  9:26 PM  Result Value Ref Range   Glucose-Capillary 150 (H) 65 - 99 mg/dL  CBC     Status: Abnormal   Collection Time: 05/19/16  4:45 AM  Result Value Ref Range   WBC 21.5 (H) 3.6 - 11.0 K/uL   RBC 3.34 (L) 3.80 - 5.20 MIL/uL   Hemoglobin 9.2 (L) 12.0 - 16.0 g/dL   HCT 27.3 (L) 35.0 - 47.0 %   MCV 81.8 80.0 - 100.0 fL   MCH 27.4 26.0 - 34.0 pg   MCHC 33.5 32.0 - 36.0 g/dL   RDW 16.5 (H) 11.5 - 14.5 %   Platelets 365 150 - 440 K/uL  Basic metabolic panel     Status: Abnormal   Collection Time: 05/19/16  4:45 AM  Result Value Ref Range   Sodium 131 (L) 135 - 145 mmol/L   Potassium 4.7 3.5 - 5.1 mmol/L   Chloride 105 101 - 111 mmol/L   CO2 17 (L) 22 - 32 mmol/L   Glucose, Bld 79 65 - 99 mg/dL   BUN 29 (H) 6 - 20 mg/dL   Creatinine, Ser 1.39 (H) 0.44 - 1.00 mg/dL   Calcium 8.0 (L) 8.9 - 10.3 mg/dL   GFR calc non Af Amer 34 (L) >60 mL/min   GFR calc Af Amer 39 (L) >60 mL/min    Comment: (NOTE) The eGFR has been calculated using the CKD EPI equation. This calculation has not been validated in all clinical situations. eGFR's persistently <60 mL/min signify possible Chronic Kidney Disease.    Anion gap 9 5 - 15  Glucose, capillary      Status: None   Collection Time: 05/19/16  7:40 AM  Result Value Ref Range   Glucose-Capillary 91 65 - 99 mg/dL   Comment 1 Notify RN   Glucose, capillary     Status: None   Collection Time: 05/19/16 11:58 AM  Result Value Ref Range   Glucose-Capillary 90 65 - 99 mg/dL   Comment 1 Notify RN    No components found for: ESR, C REACTIVE PROTEIN MICRO: Recent Results (from the past 720 hour(s))  Culture, blood (Routine X 2) w Reflex to ID Panel     Status: Abnormal (Preliminary result)   Collection Time: 05/16/16 10:01 AM  Result Value Ref Range Status   Specimen Description BLOOD RIGHT ASSIST CONTROL  Final   Special Requests BOTTLES DRAWN AEROBIC AND ANAEROBIC AER5ML ANA6ML  Final   Culture  Setup Time   Final    GRAM POSITIVE COCCI ANAEROBIC BOTTLE ONLY CRITICAL RESULT CALLED TO, READ BACK BY AND VERIFIED WITH: MICHAEL SIMPSON AT 1935 05/17/13 SDR Performed   at [facility]    Culture STAPHYLOCOCCUS AUREUS (A)  Final   Report Status PENDING  Incomplete  Culture, blood (Routine X 2) w Reflex to ID Panel     Status: None (Preliminary result)   Collection Time: 05/16/16 10:01 AM  Result Value Ref Range Status   Specimen Description BLOOD LEFT HAND  Final   Special Requests BOTTLES DRAWN AEROBIC AND ANAEROBIC ANA5ML AER6ML  Final   Culture NO GROWTH 3 DAYS  Final   Report Status PENDING  Incomplete  Blood Culture ID Panel (Reflexed)     Status: Abnormal   Collection Time: 05/16/16 10:01 AM  Result Value Ref Range Status   Enterococcus species NOT DETECTED NOT DETECTED Final   Listeria monocytogenes NOT DETECTED NOT DETECTED Final   Staphylococcus species DETECTED (A) NOT DETECTED Final    Comment: CRITICAL RESULT CALLED TO, READ BACK BY AND VERIFIED WITH:  MICHAEL SIMPSON AT 1935 05/17/16 SDR    Staphylococcus aureus DETECTED (A) NOT DETECTED Final    Comment: CRITICAL RESULT CALLED TO, READ BACK BY AND VERIFIED WITH:  MICHAEL SIMPSON AT 1935 05/17/16 SDR     Methicillin resistance NOT DETECTED NOT DETECTED Final   Streptococcus species NOT DETECTED NOT DETECTED Final   Streptococcus agalactiae NOT DETECTED NOT DETECTED Final   Streptococcus pneumoniae NOT DETECTED NOT DETECTED Final   Streptococcus pyogenes NOT DETECTED NOT DETECTED Final   Acinetobacter baumannii NOT DETECTED NOT DETECTED Final   Enterobacteriaceae species NOT DETECTED NOT DETECTED Final   Enterobacter cloacae complex NOT DETECTED NOT DETECTED Final   Escherichia coli NOT DETECTED NOT DETECTED Final   Klebsiella oxytoca NOT DETECTED NOT DETECTED Final   Klebsiella pneumoniae NOT DETECTED NOT DETECTED Final   Proteus species NOT DETECTED NOT DETECTED Final   Serratia marcescens NOT DETECTED NOT DETECTED Final   Haemophilus influenzae NOT DETECTED NOT DETECTED Final   Neisseria meningitidis NOT DETECTED NOT DETECTED Final   Pseudomonas aeruginosa NOT DETECTED NOT DETECTED Final   Candida albicans NOT DETECTED NOT DETECTED Final   Candida glabrata NOT DETECTED NOT DETECTED Final   Candida krusei NOT DETECTED NOT DETECTED Final   Candida parapsilosis NOT DETECTED NOT DETECTED Final   Candida tropicalis NOT DETECTED NOT DETECTED Final  Culture, expectorated sputum-assessment     Status: None   Collection Time: 05/16/16  4:06 PM  Result Value Ref Range Status   Specimen Description SPUTUM  Final   Special Requests Normal  Final   Sputum evaluation THIS SPECIMEN IS ACCEPTABLE FOR SPUTUM CULTURE  Final   Report Status 05/16/2016 FINAL  Final  Culture, respiratory (NON-Expectorated)     Status: None   Collection Time: 05/16/16  4:06 PM  Result Value Ref Range Status   Specimen Description SPUTUM  Final   Special Requests Normal Reflexed from [id number]  Final   Gram Stain   Final    ABUNDANT WBC PRESENT, PREDOMINANTLY PMN MODERATE GRAM POSITIVE COCCI IN PAIRS FEW GRAM NEGATIVE COCCOBACILLI RARE GRAM NEGATIVE RODS Performed at  Hospital    Culture FEW STAPHYLOCOCCUS  AUREUS  Final   Report Status 05/19/2016 FINAL  Final   Organism ID, Bacteria STAPHYLOCOCCUS AUREUS  Final      Susceptibility   Staphylococcus aureus - MIC*    CIPROFLOXACIN <=0.5 SENSITIVE Sensitive     ERYTHROMYCIN >=8 RESISTANT Resistant     GENTAMICIN <=0.5 SENSITIVE Sensitive     OXACILLIN 0.5 SENSITIVE Sensitive     TETRACYCLINE <=1 SENSITIVE Sensitive       VANCOMYCIN <=0.5 SENSITIVE Sensitive     TRIMETH/SULFA <=10 SENSITIVE Sensitive     CLINDAMYCIN <=0.25 RESISTANT Resistant     RIFAMPIN <=0.5 SENSITIVE Sensitive     Inducible Clindamycin POSITIVE Resistant     * FEW STAPHYLOCOCCUS AUREUS  Culture, blood (single) w Reflex to ID Panel     Status: None (Preliminary result)   Collection Time: 05/18/16  1:05 PM  Result Value Ref Range Status   Specimen Description BLOOD LEFT HAND  Final   Special Requests BOTTLES DRAWN AEROBIC AND ANAEROBIC ANA8ML AER7ML  Final   Culture NO GROWTH < 24 HOURS  Final   Report Status PENDING  Incomplete    IMAGING: Dg Chest 2 View  Result Date: 05/16/2016 CLINICAL DATA:  Shortness of breath and weakness for 2 weeks with productive cough. EXAM: CHEST  2 VIEW COMPARISON:  Single-view of the chest 01/26/2010. FINDINGS: Focal left basilar airspace disease is identified. Right lung is clear. Heart size is mildly enlarged. No pneumothorax or pleural effusion. Aortic atherosclerosis is seen. No acute bony abnormality. IMPRESSION: Focal left basilar airspace disease worrisome for pneumonia. Cardiomegaly without edema. Atherosclerosis. Electronically Signed   By: Inge Rise M.D.   On: 05/16/2016 09:09   ECHO 05/18/16  - Left ventricle: The cavity size was normal. There was severe   concentric hypertrophy. Systolic function was normal. The   estimated ejection fraction was in the range of 60% to 65%. - Aortic valve: Valve area (Vmax): 2.38 cm^2. - Mitral valve: There was moderate regurgitation. - Left atrium: The atrium was mildly  dilated.  Assessment:   Jodi Best is a 83 y.o. female with Post influenza, MSSA PNA and bacteremia. Clinically improving. FU bcx 1/14 pending. Echo neg for veg.  Discussed with patient and daughter that she will need a 2 week course of abx for the MSSA bacteremia associated with her PNA. No evidence endocarditis nor metastatic infection. Would not suggest a TEE at this point, given frailty and bacteremia with a known acute source.   Recommendations If bcx negative tomorrow can place PICC See abx order sheet- I will change from ceftriaxone to ancef.  Can finish  5 days of the tamiflu and a 3 day course of the 500 mg qd azithro - both stop tomorrow.  I cannot see why she is on valtrex - not listed in [facility] chart as active medicine- will dc Can be dced once stable and I can see in 2 weeks in fu  Thank you very much for allowing me to participate in the care of this patient. Please call with questions.   Cheral Marker. Ola Spurr, MD

## 2016-05-20 LAB — CBC
HEMATOCRIT: 26.2 % — AB (ref 35.0–47.0)
HEMOGLOBIN: 8.6 g/dL — AB (ref 12.0–16.0)
MCH: 26.9 pg (ref 26.0–34.0)
MCHC: 32.9 g/dL (ref 32.0–36.0)
MCV: 81.8 fL (ref 80.0–100.0)
Platelets: 350 10*3/uL (ref 150–440)
RBC: 3.21 MIL/uL — AB (ref 3.80–5.20)
RDW: 16.9 % — ABNORMAL HIGH (ref 11.5–14.5)
WBC: 19.3 10*3/uL — ABNORMAL HIGH (ref 3.6–11.0)

## 2016-05-20 LAB — CULTURE, BLOOD (ROUTINE X 2)

## 2016-05-20 LAB — GLUCOSE, CAPILLARY
GLUCOSE-CAPILLARY: 108 mg/dL — AB (ref 65–99)
GLUCOSE-CAPILLARY: 187 mg/dL — AB (ref 65–99)
Glucose-Capillary: 129 mg/dL — ABNORMAL HIGH (ref 65–99)
Glucose-Capillary: 178 mg/dL — ABNORMAL HIGH (ref 65–99)

## 2016-05-20 LAB — BASIC METABOLIC PANEL
Anion gap: 7 (ref 5–15)
BUN: 23 mg/dL — AB (ref 6–20)
CHLORIDE: 108 mmol/L (ref 101–111)
CO2: 17 mmol/L — AB (ref 22–32)
CREATININE: 1.35 mg/dL — AB (ref 0.44–1.00)
Calcium: 7.6 mg/dL — ABNORMAL LOW (ref 8.9–10.3)
GFR calc Af Amer: 40 mL/min — ABNORMAL LOW (ref >60)
GFR calc non Af Amer: 35 mL/min — ABNORMAL LOW (ref >60)
Glucose, Bld: 102 mg/dL — ABNORMAL HIGH (ref 65–99)
POTASSIUM: 4.6 mmol/L (ref 3.5–5.1)
Sodium: 132 mmol/L — ABNORMAL LOW (ref 135–145)

## 2016-05-20 MED ORDER — SODIUM CHLORIDE 0.9 % IV SOLN
INTRAVENOUS | Status: DC
  Administered 2016-05-21: 01:00:00 via INTRAVENOUS

## 2016-05-20 MED ORDER — BISACODYL 10 MG RE SUPP
10.0000 mg | Freq: Once | RECTAL | Status: AC
  Administered 2016-05-20: 10 mg via RECTAL
  Filled 2016-05-20: qty 1

## 2016-05-20 NOTE — Progress Notes (Signed)
Physical Therapy Treatment Patient Details Name: Jodi Best MRN: [id number] DOB: 03-Apr-1929 Today's Date: 05/20/2016    History of Present Illness Pt admitted for complaints of weakness and SOB. Pt now diagnosed with flu and pneumonia. Pt with history of addison's dx, DM, HTN, and OA. Pt currently on droplet isolation at this time.    PT Comments    Jodi Best did not make meaningful progress today as she presents with a flat affect with lack of motivation. Without prompting pt repeated to this PT, "I want to go on, I want to go to heaven".  When asked if the pt has every had thoughts of hurting herself she replies with, "No".  Provided emotional support to pt.  Declined speaking with a hospital pastor.  RN notified.  Recommend Psych consult. She stand from her bed with min guard assist but requires mod assist due to LOB when pivoting to get to chair.  SNF remains most appropriate d/c plan.  Pt will benefit from continued skilled PT services to increase functional independence and safety.    Follow Up Recommendations  SNF     Equipment Recommendations  Rolling walker with 5" wheels    Recommendations for Other Services Other (comment) (Psych consult)     Precautions / Restrictions Precautions Precautions: Fall Restrictions Weight Bearing Restrictions: No    Mobility  Bed Mobility Overal bed mobility: Needs Assistance Bed Mobility: Supine to Sit     Supine to sit: Supervision     General bed mobility comments: Pt with increased time and effort.  Pt reaching out for assist from this PT but pt instructed to use bed rail.  Transfers Overall transfer level: Needs assistance Equipment used: Rolling walker (2 wheeled) Transfers: Sit to/from UGI CorporationStand;Stand Pivot Transfers Sit to Stand: Min guard Stand pivot transfers: Mod assist       General transfer comment: Close min guard as pt stands quickly with both hands on RW rather than pushing from bed.  Mod assist required as  pt with LOB while pivoting to chair.  Pt denies dizziness and when asked why she lost her balance she says, "because I'm dumb".  Provided pt with emotional support.  SpO2 remained at or above 98% on 2L O2.  Ambulation/Gait             General Gait Details: did not attempt   Stairs            Wheelchair Mobility    Modified Rankin (Stroke Patients Only)       Balance Overall balance assessment: Needs assistance Sitting-balance support: Feet supported;No upper extremity supported Sitting balance-Leahy Scale: Good     Standing balance support: Bilateral upper extremity supported;During functional activity Standing balance-Leahy Scale: Poor Standing balance comment: Pt with LOB with dynamic activities and requires close min guard assist with static activities                    Cognition Arousal/Alertness: Awake/alert Behavior During Therapy: Flat affect Overall Cognitive Status: Within Functional Limits for tasks assessed                      Exercises General Exercises - Upper Extremity Shoulder Flexion: AROM;Both;10 reps;Seated General Exercises - Lower Extremity Long Arc Quad: AROM;Both;10 reps;Seated Hip Flexion/Marching: Both;10 reps;Strengthening;Seated    General Comments General comments (skin integrity, edema, etc.): Without prompting pt repeated to this PT, "I want to go on, I want to go to heaven".  When asked if  the pt has every had thoughts of hurting herself she replies with, "No".  Provided emotional support to pt.  Declined speaking with a hospital pastor.      Pertinent Vitals/Pain      Home Living                      Prior Function            PT Goals (current goals can now be found in the care plan section) Acute Rehab PT Goals Patient Stated Goal: to feel better PT Goal Formulation: With patient Time For Goal Achievement: 06/01/16 Potential to Achieve Goals: Good Progress towards PT goals: Not progressing  toward goals - comment (due to flat affect and lack of motivation)    Frequency    Min 2X/week      PT Plan Current plan remains appropriate    Co-evaluation             End of Session Equipment Utilized During Treatment: Gait belt;Oxygen Activity Tolerance: Patient limited by fatigue Patient left: in chair;with call bell/phone within reach (no chair alarm available; RN notified )     Time: 1610-9604 PT Time Calculation (min) (ACUTE ONLY): 21 min  Charges:  $Therapeutic Activity: 8-22 mins                    G Codes:      Encarnacion Chu PT, DPT 05/20/2016, 3:28 PM

## 2016-05-20 NOTE — Progress Notes (Signed)
[facility] CLINIC INFECTIOUS DISEASE PROGRESS NOTE Date of Admission:  05/16/2016     ID: Jodi Best is a 83 y.o. female with MSSA bacteremia, PNA and Flu Active Problems:   PNA (pneumonia)   Pressure injury of skin   Subjective: No fevers,  Says just wants to go on  ROS  Unable to obtain   Medications:  Antibiotics Given (last 72 hours)    Date/Time Action Medication Dose Rate   05/18/16 0941 Given   valACYclovir (VALTREX) tablet 1,000 mg 1,000 mg    05/18/16 0942 Given   oseltamivir (TAMIFLU) capsule 30 mg 30 mg    05/18/16 0942 Given   cefTRIAXone (ROCEPHIN) IVPB 2 g 2 g 100 mL/hr   05/18/16 0953 Given   azithromycin (ZITHROMAX) tablet 500 mg 500 mg    05/19/16 1118 Given   oseltamivir (TAMIFLU) capsule 30 mg 30 mg    05/19/16 1118 Given   azithromycin (ZITHROMAX) tablet 500 mg 500 mg    05/19/16 1119 Given   valACYclovir (VALTREX) tablet 1,000 mg 1,000 mg    05/19/16 1456 Given   ceFAZolin (ANCEF) IVPB 1 g/50 mL premix 1 g 100 mL/hr   05/19/16 2148 Given   ceFAZolin (ANCEF) IVPB 1 g/50 mL premix 1 g 100 mL/hr   05/20/16 1027 Given   ceFAZolin (ANCEF) IVPB 1 g/50 mL premix 1 g 100 mL/hr     . aspirin EC  81 mg Oral Daily  . calcium carbonate  1,500 mg Oral BID WC  .  ceFAZolin (ANCEF) IV  1 g Intravenous Q12H  . cholecalciferol  1,000 Units Oral Daily  . citalopram  10 mg Oral Daily  . guaiFENesin  600 mg Oral BID   And  . dextromethorphan  30 mg Oral BID  . fluticasone  2 spray Each Nare QHS  . heparin subcutaneous  5,000 Units Subcutaneous Q8H  . hydrocortisone  20 mg Oral Daily  . insulin aspart  0-9 Units Subcutaneous TID WC  . loratadine  10 mg Oral Daily  . mouth rinse  15 mL Mouth Rinse BID  . metoprolol  12.5 mg Oral BID  . oseltamivir  30 mg Oral Daily  . pravastatin  20 mg Oral q1800  . senna-docusate  2 tablet Oral Daily  . Travoprost (BAK Free)  1 drop Both Eyes QHS    Objective: Vital signs in last 24 hours: Temp:  [97.6 F (36.4  C)-98.2 F (36.8 C)] 97.7 F (36.5 C) (01/16 0815) Pulse Rate:  [89-93] 89 (01/16 0815) Resp:  [20-22] 20 (01/16 0815) BP: (119-135)/(54-61) 124/59 (01/16 0815) SpO2:  [97 %-98 %] 97 % (01/16 0815) Constitutional: frail,  HENT: [place]/AT, PERRLA, no scleral icterus Mouth/Throat: Oropharynx is clear and dry. No oropharyngeal exudate.  Cardiovascular: Normal rate, regular rhythm and normal heart sounds.  Pulmonary/Chest: bil rhonchi Neck = supple, no nuchal rigidity Abdominal: Soft. Bowel sounds are normal.  exhibits no distension. There is no tenderness.  Lymphadenopathy: no cervical adenopathy. No axillary adenopathy Neurological: alert and oriented to person, place, and time.  Skin: Skin is warm and dry. No rash noted. No erythema.  Psychiatric: flat affect  Lab Results  Recent Labs  05/19/16 0445 05/20/16 0332  WBC 21.5* 19.3*  HGB 9.2* 8.6*  HCT 27.3* 26.2*  NA 131* 132*  K 4.7 4.6  CL 105 108  CO2 17* 17*  BUN 29* 23*  CREATININE 1.39* 1.35*    Microbiology: Results for orders placed or performed during the hospital  encounter of 05/16/16  Culture, blood (Routine X 2) w Reflex to ID Panel     Status: Abnormal   Collection Time: 05/16/16 10:01 AM  Result Value Ref Range Status   Specimen Description BLOOD RIGHT ASSIST CONTROL  Final   Special Requests BOTTLES DRAWN AEROBIC AND ANAEROBIC AER5ML ANA6ML  Final   Culture  Setup Time   Final    GRAM POSITIVE COCCI ANAEROBIC BOTTLE ONLY CRITICAL RESULT CALLED TO, READ BACK BY AND VERIFIED WITH: MICHAEL SIMPSON AT 1935 05/17/13 SDR Performed at [facility]    Culture STAPHYLOCOCCUS AUREUS (A)  Final   Report Status 05/20/2016 FINAL  Final   Organism ID, Bacteria STAPHYLOCOCCUS AUREUS  Final      Susceptibility   Staphylococcus aureus - MIC*    CIPROFLOXACIN <=0.5 SENSITIVE Sensitive     ERYTHROMYCIN >=8 RESISTANT Resistant     GENTAMICIN <=0.5 SENSITIVE Sensitive     OXACILLIN 0.5 SENSITIVE Sensitive      TETRACYCLINE <=1 SENSITIVE Sensitive     VANCOMYCIN <=0.5 SENSITIVE Sensitive     TRIMETH/SULFA <=10 SENSITIVE Sensitive     CLINDAMYCIN <=0.25 RESISTANT Resistant     RIFAMPIN <=0.5 SENSITIVE Sensitive     Inducible Clindamycin POSITIVE Resistant     * STAPHYLOCOCCUS AUREUS  Culture, blood (Routine X 2) w Reflex to ID Panel     Status: None (Preliminary result)   Collection Time: 05/16/16 10:01 AM  Result Value Ref Range Status   Specimen Description BLOOD LEFT HAND  Final   Special Requests BOTTLES DRAWN AEROBIC AND ANAEROBIC ANA5ML AER6ML  Final   Culture NO GROWTH 4 DAYS  Final   Report Status PENDING  Incomplete  Blood Culture ID Panel (Reflexed)     Status: Abnormal   Collection Time: 05/16/16 10:01 AM  Result Value Ref Range Status   Enterococcus species NOT DETECTED NOT DETECTED Final   Listeria monocytogenes NOT DETECTED NOT DETECTED Final   Staphylococcus species DETECTED (A) NOT DETECTED Final    Comment: CRITICAL RESULT CALLED TO, READ BACK BY AND VERIFIED WITH:  MICHAEL SIMPSON AT 1935 05/17/16 SDR    Staphylococcus aureus DETECTED (A) NOT DETECTED Final    Comment: CRITICAL RESULT CALLED TO, READ BACK BY AND VERIFIED WITH:  MICHAEL SIMPSON AT 1935 05/17/16 SDR    Methicillin resistance NOT DETECTED NOT DETECTED Final   Streptococcus species NOT DETECTED NOT DETECTED Final   Streptococcus agalactiae NOT DETECTED NOT DETECTED Final   Streptococcus pneumoniae NOT DETECTED NOT DETECTED Final   Streptococcus pyogenes NOT DETECTED NOT DETECTED Final   Acinetobacter baumannii NOT DETECTED NOT DETECTED Final   Enterobacteriaceae species NOT DETECTED NOT DETECTED Final   Enterobacter cloacae complex NOT DETECTED NOT DETECTED Final   Escherichia coli NOT DETECTED NOT DETECTED Final   Klebsiella oxytoca NOT DETECTED NOT DETECTED Final   Klebsiella pneumoniae NOT DETECTED NOT DETECTED Final   Proteus species NOT DETECTED NOT DETECTED Final   Serratia marcescens NOT DETECTED NOT  DETECTED Final   Haemophilus influenzae NOT DETECTED NOT DETECTED Final   Neisseria meningitidis NOT DETECTED NOT DETECTED Final   Pseudomonas aeruginosa NOT DETECTED NOT DETECTED Final   Candida albicans NOT DETECTED NOT DETECTED Final   Candida glabrata NOT DETECTED NOT DETECTED Final   Candida krusei NOT DETECTED NOT DETECTED Final   Candida parapsilosis NOT DETECTED NOT DETECTED Final   Candida tropicalis NOT DETECTED NOT DETECTED Final  Culture, expectorated sputum-assessment     Status: None   Collection Time:  05/16/16  4:06 PM  Result Value Ref Range Status   Specimen Description SPUTUM  Final   Special Requests Normal  Final   Sputum evaluation THIS SPECIMEN IS ACCEPTABLE FOR SPUTUM CULTURE  Final   Report Status 05/16/2016 FINAL  Final  Culture, respiratory (NON-Expectorated)     Status: None   Collection Time: 05/16/16  4:06 PM  Result Value Ref Range Status   Specimen Description SPUTUM  Final   Special Requests Normal Reflexed from [id number]  Final   Gram Stain   Final    ABUNDANT WBC PRESENT, PREDOMINANTLY PMN MODERATE GRAM POSITIVE COCCI IN PAIRS FEW GRAM NEGATIVE COCCOBACILLI RARE GRAM NEGATIVE RODS [facility]    Culture FEW STAPHYLOCOCCUS AUREUS  Final   Report Status 05/19/2016 FINAL  Final   Organism ID, Bacteria STAPHYLOCOCCUS AUREUS  Final      Susceptibility   Staphylococcus aureus - MIC*    CIPROFLOXACIN <=0.5 SENSITIVE Sensitive     ERYTHROMYCIN >=8 RESISTANT Resistant     GENTAMICIN <=0.5 SENSITIVE Sensitive     OXACILLIN 0.5 SENSITIVE Sensitive     TETRACYCLINE <=1 SENSITIVE Sensitive     VANCOMYCIN <=0.5 SENSITIVE Sensitive     TRIMETH/SULFA <=10 SENSITIVE Sensitive     CLINDAMYCIN <=0.25 RESISTANT Resistant     RIFAMPIN <=0.5 SENSITIVE Sensitive     Inducible Clindamycin POSITIVE Resistant     * FEW STAPHYLOCOCCUS AUREUS  Culture, blood (single) w Reflex to ID Panel     Status: None (Preliminary result)   Collection Time:  05/18/16  1:05 PM  Result Value Ref Range Status   Specimen Description BLOOD LEFT HAND  Final   Special Requests BOTTLES DRAWN AEROBIC AND ANAEROBIC ANA8ML AER7ML  Final   Culture NO GROWTH 2 DAYS  Final   Report Status PENDING  Incomplete     Studies/Results: No results found.  Assessment/Plan: Jodi Scullancy L Howland is a 83 y.o. female with Post influenza, MSSA PNA and bacteremia. Clinically improving. FU bcx 1/14 NGTD . Echo neg for veg.  Discussed with patient and daughter that she will need a 2 week course of abx for the MSSA bacteremia associated with her PNA. No evidence endocarditis nor metastatic infection. Would not suggest a TEE at this point, given frailty and bacteremia with a known acute source.  PICC placed.   Recommendations See abx order sheet- Finished   5 days of the tamiflu and a 3 day course of the 500 mg qd azithro Can be dced once stable and I can see in 2 weeks in fu Thank you very much for the consult. Will follow with you.  Elyon Zoll P   05/20/2016, 11:25 AM

## 2016-05-20 NOTE — Progress Notes (Signed)
[employer] [facility] [facility] [place] at [facility]   PATIENT NAME: Jodi Best    MR#:  [id number]  DATE OF BIRTH:  1929-04-30  SUBJECTIVE:  CHIEF COMPLAINT:   Chief Complaint  Patient presents with  . Flu-like symptoms   - Better eye contact today. Continues to be depressed. -Remains on 3 L oxygen. Family at bedside - PT recommended rehab  REVIEW OF SYSTEMS:  Review of Systems  Constitutional: Positive for malaise/fatigue. Negative for chills and fever.  HENT: Positive for congestion. Negative for ear discharge, ear pain, hearing loss and nosebleeds.   Eyes: Negative for blurred vision and double vision.  Respiratory: Positive for cough and sputum production. Negative for shortness of breath and wheezing.   Cardiovascular: Negative for chest pain, palpitations and leg swelling.  Gastrointestinal: Negative for abdominal pain, constipation, diarrhea, nausea and vomiting.       Decreased appetite  Genitourinary: Negative for dysuria.  Musculoskeletal: Negative for myalgias.  Neurological: Positive for weakness. Negative for dizziness, speech change, focal weakness, seizures and headaches.  Psychiatric/Behavioral: Positive for depression.    DRUG ALLERGIES:   Allergies  Allergen Reactions  . Codeine     unlnown  . Penicillins     Has patient had a PCN reaction causing immediate rash, facial/tongue/throat swelling, SOB or lightheadedness with hypotension: no Has patient had a PCN reaction causing severe rash involving mucus membranes or skin necrosis: no Has patient had a PCN reaction that required hospitalization no Has patient had a PCN reaction occurring within the last 10 years: yes If all of the above answers are "NO", then may proceed with Cephalosporin use.     VITALS:  Blood pressure 134/65, pulse 85, temperature 97.6 F (36.4 C), temperature source Oral, resp. rate (!) 22, height 5\' 3"  (1.6 m), weight 74.8 kg (165 lb), SpO2 96 %.  PHYSICAL EXAMINATION:    Physical Exam  GENERAL:  83 y.o.-year-old patient lying in the bed with no acute distress.  EYES: Pupils equal, round, reactive to light and accommodation. No scleral icterus. Extraocular muscles intact.  HEENT: Head atraumatic, normocephalic. Oropharynx and nasopharynx clear.  NECK:  Supple, no jugular venous distention. No thyroid enlargement, no tenderness.  LUNGS: Normal breath sounds bilaterally, no wheezing, rales or crepitation. No use of accessory muscles of respiration. Bibasilar rhonchi heard CARDIOVASCULAR: S1, S2 normal. No  rubs, or gallops. 2/6 systolic murmur present ABDOMEN: Soft, nontender, nondistended. Bowel sounds present. No organomegaly or mass.  EXTREMITIES: No pedal edema, cyanosis, or clubbing.  NEUROLOGIC: Cranial nerves II through XII are intact. Muscle strength 5/5 in all extremities. Sensation intact. Gait not checked. Global  weakness noted PSYCHIATRIC: The patient is alert and oriented x 3. Flat affect, depressed SKIN: No obvious rash, lesion, or ulcer.    LABORATORY PANEL:   CBC  Recent Labs Lab 05/20/16 0332  WBC 19.3*  HGB 8.6*  HCT 26.2*  PLT 350   ------------------------------------------------------------------------------------------------------------------  Chemistries   Recent Labs Lab 05/16/16 0909  05/20/16 0332  NA 128*  < > 132*  K 4.7  < > 4.6  CL 96*  < > 108  CO2 19*  < > 17*  GLUCOSE 148*  < > 102*  BUN 59*  < > 23*  CREATININE 2.75*  < > 1.35*  CALCIUM 8.9  < > 7.6*  AST 22  --   --   ALT 11*  --   --   ALKPHOS 92  --   --   BILITOT 1.0  --   --   < > =  values in this interval not displayed. ------------------------------------------------------------------------------------------------------------------  Cardiac Enzymes  Recent Labs Lab 05/16/16 0909  TROPONINI 0.05*   ------------------------------------------------------------------------------------------------------------------  RADIOLOGY:  No results  found.  EKG:   Orders placed or performed during the hospital encounter of 05/16/16  . ED EKG  . ED EKG    ASSESSMENT AND PLAN:   83 year old female with past medical history significant for Addison's disease, diabetes, hypertension, arthritis and hyperlipidemia presents from home secondary to cough and generalized weakness.  #1 acute hypoxic respiratory failure- secondary to influenza a and also superimposed left basilar pneumonia. -Patient not on home oxygen. requiring 5 L oxygen on admisison. Wean as tolerated- weaned to 3L  - encourage incentive spirometry -Continue Tamiflu x 5 days. -Blood cultures are growing staph bacteremia- On Ancef at this time. Will need 2 weeks of antibiotics. Repeat blood cultures are negative. PICC line has been ordered. -Appreciate ID consult.  - ECHO negative for any vegetations -Cough medications have been adjusted. -  #2 Sepsis- secondary to pneumonia, has a staph aureus bacteremia - on rocephin - ABX adjusted, f/u wbc count-improving now  #2 hyponatremia-dehydration and also secondary to influenza illness. -improving with iv fluids  #3 acute renal failure-has known CKD, baseline creatinine around 1.3-1.5. Creatinine worse secondary to ATN from infection on admission. -Avoid nephrotoxins.  -Improved with IV fluids and back to baseline now.  #4 Addison's disease-continue hydrocortisone. Blood pressure is low normal, so discontinue Norvasc and decrease metoprolol dosage for now.  #5 Depression- depressed from the illness, no active suicidal ideation. Started low-dose Celexa Psych consult today  #6 DVT prophylaxis-on Lovenox   Son and daughter updated at bedside   Physical therapy consulted. Patient ambulates with cane at baseline. Recommended SNF- social worker aware  All the records are reviewed and case discussed with Care Management/Social Workerr. Management plans discussed with the patient, family and they are in  agreement.  CODE STATUS: Full code  TOTAL TIME TAKING CARE OF THIS PATIENT: 36 minutes.   POSSIBLE D/C TOMORROW, DEPENDING ON CLINICAL CONDITION.   Enid Baas M.D on 05/20/2016 at 4:10 PM  Between 7am to 6pm - Pager - [phone]  After 6pm go to [email or web address] - Social research officer, government  [facility] [place] [facility]  Office  [phone]  CC: Primary care physician; No PCP Per Patient

## 2016-05-20 NOTE — Care Management Important Message (Signed)
Important Message  Patient Details  Name: Jodi Best L Pare MRN: [id number] Date of Birth: 17-Aug-1928   Medicare Important Message Given:  Yes    Chapman FitchBOWEN, Mauri Temkin T, RN 05/20/2016, 4:27 PM

## 2016-05-21 DIAGNOSIS — F4323 Adjustment disorder with mixed anxiety and depressed mood: Secondary | ICD-10-CM

## 2016-05-21 LAB — BASIC METABOLIC PANEL
ANION GAP: 5 (ref 5–15)
BUN: 16 mg/dL (ref 6–20)
CALCIUM: 8.2 mg/dL — AB (ref 8.9–10.3)
CO2: 19 mmol/L — ABNORMAL LOW (ref 22–32)
CREATININE: 1.09 mg/dL — AB (ref 0.44–1.00)
Chloride: 109 mmol/L (ref 101–111)
GFR calc Af Amer: 52 mL/min — ABNORMAL LOW (ref >60)
GFR, EST NON AFRICAN AMERICAN: 45 mL/min — AB (ref >60)
GLUCOSE: 77 mg/dL (ref 65–99)
Potassium: 4 mmol/L (ref 3.5–5.1)
Sodium: 133 mmol/L — ABNORMAL LOW (ref 135–145)

## 2016-05-21 LAB — CBC
HCT: 26 % — ABNORMAL LOW (ref 35.0–47.0)
Hemoglobin: 8.6 g/dL — ABNORMAL LOW (ref 12.0–16.0)
MCH: 27 pg (ref 26.0–34.0)
MCHC: 33.2 g/dL (ref 32.0–36.0)
MCV: 81.1 fL (ref 80.0–100.0)
PLATELETS: 345 10*3/uL (ref 150–440)
RBC: 3.21 MIL/uL — ABNORMAL LOW (ref 3.80–5.20)
RDW: 16.9 % — AB (ref 11.5–14.5)
WBC: 14.9 10*3/uL — AB (ref 3.6–11.0)

## 2016-05-21 LAB — CULTURE, BLOOD (ROUTINE X 2): Culture: NO GROWTH

## 2016-05-21 LAB — GLUCOSE, CAPILLARY
GLUCOSE-CAPILLARY: 158 mg/dL — AB (ref 65–99)
Glucose-Capillary: 82 mg/dL (ref 65–99)
Glucose-Capillary: 110 mg/dL — ABNORMAL HIGH (ref 65–99)
Glucose-Capillary: 211 mg/dL — ABNORMAL HIGH (ref 65–99)

## 2016-05-21 MED ORDER — ENOXAPARIN SODIUM 40 MG/0.4ML ~~LOC~~ SOLN
40.0000 mg | SUBCUTANEOUS | Status: DC
  Administered 2016-05-21: 40 mg via SUBCUTANEOUS
  Filled 2016-05-21: qty 0.4

## 2016-05-21 NOTE — Progress Notes (Signed)
[employer] - [place] at [facility]   PATIENT NAME: Jodi Best    MR#:  [id number]  DATE OF BIRTH:  August 03, 1928  SUBJECTIVE:  CHIEF COMPLAINT:   Chief Complaint  Patient presents with  . Flu-like symptoms   - slowly improving, still appears depressed -down to 1l o2, trying to do incentive spirometry - son and daughter at bedside  REVIEW OF SYSTEMS:  Review of Systems  Constitutional: Negative for chills, fever and malaise/fatigue.  HENT: Positive for congestion. Negative for ear discharge, ear pain, hearing loss and nosebleeds.   Eyes: Negative for blurred vision and double vision.  Respiratory: Positive for cough. Negative for sputum production, shortness of breath and wheezing.   Cardiovascular: Negative for chest pain, palpitations and leg swelling.  Gastrointestinal: Negative for abdominal pain, constipation, diarrhea, nausea and vomiting.       Decreased appetite  Genitourinary: Negative for dysuria.  Musculoskeletal: Negative for myalgias.  Neurological: Positive for weakness. Negative for dizziness, speech change, focal weakness, seizures and headaches.  Psychiatric/Behavioral: Positive for depression.    DRUG ALLERGIES:   Allergies  Allergen Reactions  . Codeine     unlnown  . Penicillins     Has patient had a PCN reaction causing immediate rash, facial/tongue/throat swelling, SOB or lightheadedness with hypotension: no Has patient had a PCN reaction causing severe rash involving mucus membranes or skin necrosis: no Has patient had a PCN reaction that required hospitalization no Has patient had a PCN reaction occurring within the last 10 years: yes If all of the above answers are "NO", then may proceed with Cephalosporin use.     VITALS:  Blood pressure (!) 130/52, pulse 94, temperature 97.6 F (36.4 C), temperature source Oral, resp. rate 16, height 5\' 3"  (1.6 m), weight 74.8 kg (165 lb), SpO2 95 %.  PHYSICAL EXAMINATION:  Physical  Exam  GENERAL:  83 y.o.-year-old patient lying in the bed with no acute distress.  EYES: Pupils equal, round, reactive to light and accommodation. No scleral icterus. Extraocular muscles intact.  HEENT: Head atraumatic, normocephalic. Oropharynx and nasopharynx clear.  NECK:  Supple, no jugular venous distention. No thyroid enlargement, no tenderness.  LUNGS: Normal breath sounds bilaterally, no wheezing, rales or crepitation. No use of accessory muscles of respiration. Bibasilar rhonchi heard CARDIOVASCULAR: S1, S2 normal. No  rubs, or gallops. 2/6 systolic murmur present ABDOMEN: Soft, nontender, nondistended. Bowel sounds present. No organomegaly or mass.  EXTREMITIES: No pedal edema, cyanosis, or clubbing.  NEUROLOGIC: Cranial nerves II through XII are intact. Muscle strength 5/5 in all extremities. Sensation intact. Gait not checked. Global  weakness noted PSYCHIATRIC: The patient is alert and oriented x 3. Flat affect, depressed SKIN: No obvious rash, lesion, or ulcer.    LABORATORY PANEL:   CBC  Recent Labs Lab 05/21/16 0744  WBC 14.9*  HGB 8.6*  HCT 26.0*  PLT 345   ------------------------------------------------------------------------------------------------------------------  Chemistries   Recent Labs Lab 05/16/16 0909  05/21/16 0744  NA 128*  < > 133*  K 4.7  < > 4.0  CL 96*  < > 109  CO2 19*  < > 19*  GLUCOSE 148*  < > 77  BUN 59*  < > 16  CREATININE 2.75*  < > 1.09*  CALCIUM 8.9  < > 8.2*  AST 22  --   --   ALT 11*  --   --   ALKPHOS 92  --   --   BILITOT 1.0  --   --   < > =  values in this interval not displayed. ------------------------------------------------------------------------------------------------------------------  Cardiac Enzymes  Recent Labs Lab 05/16/16 0909  TROPONINI 0.05*   ------------------------------------------------------------------------------------------------------------------  RADIOLOGY:  No results  found.  EKG:   Orders placed or performed during the hospital encounter of 05/16/16  . ED EKG  . ED EKG    ASSESSMENT AND PLAN:   83 year old female with past medical history significant for Addison's disease, diabetes, hypertension, arthritis and hyperlipidemia presents from home secondary to cough and generalized weakness.  #1 acute hypoxic respiratory failure- secondary to influenza a and also superimposed left basilar pneumonia. -Patient not on home oxygen. requiring 5 L oxygen on admisison. Wean as tolerated- weaned to 1L  - encourage incentive spirometry -Continue Tamiflu x 5 days. -Blood cultures are growing staph bacteremia- On Ancef at this time. Will need 2 weeks of antibiotics. Repeat blood cultures are negative. PICC line has been placed. -Appreciate ID consult.  - ECHO negative for any vegetations -Cough medications have been adjusted. - #2 Sepsis- secondary to pneumonia, has a staph aureus bacteremia - on ancef for 2 weeks until 06/01/16 - ABX adjusted,  wbc count-improving now  #3 acute renal failure-has known CKD, baseline creatinine around 1.3-1.5. Creatinine worse secondary to ATN from infection on admission. -Avoid nephrotoxins.  -Improved with IV fluids and back to baseline now.  #4 Addison's disease-continue hydrocortisone. Blood pressure is low normal, so discontinue Norvasc and decrease metoprolol dosage for now.  #5 Depression- depressed from the illness, no active suicidal ideation. Started low-dose Celexa Psych consult appreciated  #6 DVT prophylaxis-on Lovenox   Son and daughter updated at bedside Discharge tomorrow, canceled today due to inclement weather   Physical therapy consulted. Patient ambulates with cane at baseline. Recommended SNF- social worker aware  All the records are reviewed and case discussed with Care Management/Social Workerr. Management plans discussed with the patient, family and they are in agreement.  CODE STATUS: Full  code  TOTAL TIME TAKING CARE OF THIS PATIENT: 37 minutes.   POSSIBLE D/C TOMORROW, DEPENDING ON CLINICAL CONDITION.   Enid Baas M.D on 05/21/2016 at 1:45 PM  Between 7am to 6pm - Pager - [phone]  After 6pm go to [email or web address] - Social research officer, government  [facility] [place] [facility]  Office  [phone]  CC: Primary care physician; No PCP Per Patient

## 2016-05-21 NOTE — Clinical Social Work Note (Signed)
Clinical Social Work Assessment  Patient Details  Name: Jodi Best MRN: [id number] Date of Birth: 13-Dec-1928  Date of referral:  05/21/16               Reason for consult:  Facility Placement                Permission sought to share information with:  Facility Sport and exercise psychologist, Family Supports Permission granted to share information::  Yes, Verbal Permission Granted  Name::        Agency::     Relationship::     Contact Information:     Housing/Transportation Living arrangements for the past 2 months:  Single Family Home Source of Information:   (Family and patient) Patient Interpreter Needed:  None Criminal Activity/Legal Involvement Pertinent to Current Situation/Hospitalization:  No - Comment as needed Significant Relationships:   (Family) Lives with:  Self Do you feel safe going back to the place where you live?  Yes Need for family participation in patient care:  Yes (Comment)  Care giving concerns:  Patient will require assistance with her mobility at discharge.   Social Worker assessment / plan:  CSW met with patient and family this morning and all are in agreement with STR and prefer [facility]. Patient states that she has not been to rehab in the past. CSW answered questions of patient and family.   Employment status:  Retired Forensic scientist:  Medicare PT Recommendations:  [place] / Referral to community resources:     Patient/Family's Response to care:  Expressed appreciation for CSW assistance.  Patient/Family's Understanding of and Emotional Response to Diagnosis, Current Treatment, and Prognosis:  Patient's family verbalized understanding of how medicare works as it pertains to [employer].  Patient in agreement and willing to pursue STR.  Emotional Assessment Appearance:  Appears stated age Attitude/Demeanor/Rapport:   (pleasant) Affect (typically observed):  Accepting, Adaptable, Quiet, Pleasant, Calm Orientation:   Oriented to Self, Oriented to Place Alcohol / Substance use:  Not Applicable Psych involvement (Current and /or in the community):  No (Comment)  Discharge Needs  Concerns to be addressed:  Care Coordination Readmission within the last 30 days:  No Current discharge risk:  None Barriers to Discharge:  No Barriers Identified   Shela Leff, LCSW 05/21/2016, 10:55 AM

## 2016-05-21 NOTE — Consult Note (Signed)
[facility] Consult   Reason for Consult:  Consult for 83 year old woman with no past psychiatric history because of concerns about depression Referring Physician:  Tressia Miners Patient Identification: Jodi Best MRN:  [id number] Principal Diagnosis: Adjustment disorder with mixed anxiety and depressed mood Diagnosis:   Patient Active Problem List   Diagnosis Date Noted  . Adjustment disorder with mixed anxiety and depressed mood [F43.23] 05/21/2016  . Pressure injury of skin [L89.90] 05/17/2016  . PNA (pneumonia) [J18.9] 05/16/2016    Total Time spent with patient: 1 hour  Subjective:   Jodi Best is a 83 y.o. female patient admitted with "I am ready to pass on".  HPI:  Patient interviewed. Also spoke with the patient's daughter who was in the room and the patient's son. Chart reviewed. 89 year old woman brought into the hospital with gradual decline in health over the last couple weeks now diagnosed with pneumonia and apparently sepsis. Patient herself tells me that she is "ready to pass on". I ask her if she actually has any thoughts of hurting herself and she denies it. Says she is fully committed to being cooperative with medical care as well. She is a little vague about whether she has felt depressed. Admits that her energy level is low. On interview today she says that she has nothing in her life that makes her feel good or happy and can't remember anything in the past that made her feel good or happy. Denies any hallucinations. She had not been on any psychiatric medicine prior to admission. Has not done anything to try to hurt herself. Not drinking or abusing drugs. The daughter gives a more reasonable history. She reports that the patient was in her normal mental state which is very energetic and upbeat cognitively well and without any sign of depression at all until the last days of December. Since then she is been having more medical problems. Twice she was told  that she simply had a viral infection and not to worry about it but continued to feel sick. Finally brought into the hospital with now a bacterial pneumonia. Daughter reports that the patient has been talking about being depressed and down and having these thoughts of wishing to "pass on" just in the last day or so. They are not at all like her normal state.  Social history: Patient lives with her adult daughter. She has 2 other children. Good relationships with all of them.  Medical history: Patient has borderline elevated blood sugar, vitamin D 12 deficiency, also has Addison's disease and takes cortisone regularly  Substance abuse history: No history of alcohol or drug abuse  Past Psychiatric History: No previous psychiatric history. Never seen a therapist or psychiatrist or been in a psychiatric hospital. No history of suicide attempts  Risk to Self: Is patient at risk for suicide?: No Risk to Others:   Prior Inpatient Therapy:   Prior Outpatient Therapy:    Past Medical History:  Past Medical History:  Diagnosis Date  . Addison disease ([place])   . Cataract   . Diabetes mellitus without complication ([place])   . Hyperlipemia   . Hypertension   . Osteoarthritis     Past Surgical History:  Procedure Laterality Date  . CORNEAL TRANSPLANT    . EYE SURGERY    . LUMBAR LAMINECTOMY    . WRIST SURGERY     Family History:  Family History  Problem Relation Age of Onset  . Hypertension Mother    Family Psychiatric  History: No family history is known of Social History:  History  Alcohol Use No     History  Drug Use No    Social History   Social History  . Marital status: Widowed    Spouse name: N/A  . Number of children: N/A  . Years of education: N/A   Social History Main Topics  . Smoking status: Never Smoker  . Smokeless tobacco: Never Used  . Alcohol use No  . Drug use: No  . Sexual activity: Not Asked   Other Topics Concern  . None   Social History Narrative   . None   Additional Social History:    Allergies:   Allergies  Allergen Reactions  . Codeine     unlnown  . Penicillins     Has patient had a PCN reaction causing immediate rash, facial/tongue/throat swelling, SOB or lightheadedness with hypotension: no Has patient had a PCN reaction causing severe rash involving mucus membranes or skin necrosis: no Has patient had a PCN reaction that required hospitalization no Has patient had a PCN reaction occurring within the last 10 years: yes If all of the above answers are "NO", then may proceed with Cephalosporin use.     Labs:  Results for orders placed or performed during the hospital encounter of 05/16/16 (from the past 48 hour(s))  Glucose, capillary     Status: Abnormal   Collection Time: 05/19/16  5:19 PM  Result Value Ref Range   Glucose-Capillary 147 (H) 65 - 99 mg/dL   Comment 1 Notify RN   Glucose, capillary     Status: Abnormal   Collection Time: 05/19/16  9:39 PM  Result Value Ref Range   Glucose-Capillary 171 (H) 65 - 99 mg/dL  CBC     Status: Abnormal   Collection Time: 05/20/16  3:32 AM  Result Value Ref Range   WBC 19.3 (H) 3.6 - 11.0 K/uL   RBC 3.21 (L) 3.80 - 5.20 MIL/uL   Hemoglobin 8.6 (L) 12.0 - 16.0 g/dL   HCT 26.2 (L) 35.0 - 47.0 %   MCV 81.8 80.0 - 100.0 fL   MCH 26.9 26.0 - 34.0 pg   MCHC 32.9 32.0 - 36.0 g/dL   RDW 16.9 (H) 11.5 - 14.5 %   Platelets 350 150 - 440 K/uL  Basic metabolic panel     Status: Abnormal   Collection Time: 05/20/16  3:32 AM  Result Value Ref Range   Sodium 132 (L) 135 - 145 mmol/L   Potassium 4.6 3.5 - 5.1 mmol/L   Chloride 108 101 - 111 mmol/L   CO2 17 (L) 22 - 32 mmol/L   Glucose, Bld 102 (H) 65 - 99 mg/dL   BUN 23 (H) 6 - 20 mg/dL   Creatinine, Ser 1.35 (H) 0.44 - 1.00 mg/dL   Calcium 7.6 (L) 8.9 - 10.3 mg/dL   GFR calc non Af Amer 35 (L) >60 mL/min   GFR calc Af Amer 40 (L) >60 mL/min    Comment: (NOTE) The eGFR has been calculated using the CKD EPI equation. This  calculation has not been validated in all clinical situations. eGFR's persistently <60 mL/min signify possible Chronic Kidney Disease.    Anion gap 7 5 - 15  Glucose, capillary     Status: Abnormal   Collection Time: 05/20/16  8:11 AM  Result Value Ref Range   Glucose-Capillary 108 (H) 65 - 99 mg/dL   Comment 1 Notify RN   Glucose, capillary  Status: Abnormal   Collection Time: 05/20/16 12:19 PM  Result Value Ref Range   Glucose-Capillary 129 (H) 65 - 99 mg/dL   Comment 1 Notify RN   Glucose, capillary     Status: Abnormal   Collection Time: 05/20/16  5:02 PM  Result Value Ref Range   Glucose-Capillary 187 (H) 65 - 99 mg/dL   Comment 1 Notify RN   Glucose, capillary     Status: Abnormal   Collection Time: 05/20/16  8:48 PM  Result Value Ref Range   Glucose-Capillary 178 (H) 65 - 99 mg/dL   Comment 1 Notify RN   Glucose, capillary     Status: None   Collection Time: 05/21/16  7:29 AM  Result Value Ref Range   Glucose-Capillary 82 65 - 99 mg/dL  CBC     Status: Abnormal   Collection Time: 05/21/16  7:44 AM  Result Value Ref Range   WBC 14.9 (H) 3.6 - 11.0 K/uL   RBC 3.21 (L) 3.80 - 5.20 MIL/uL   Hemoglobin 8.6 (L) 12.0 - 16.0 g/dL   HCT 26.0 (L) 35.0 - 47.0 %   MCV 81.1 80.0 - 100.0 fL   MCH 27.0 26.0 - 34.0 pg   MCHC 33.2 32.0 - 36.0 g/dL   RDW 16.9 (H) 11.5 - 14.5 %   Platelets 345 150 - 440 K/uL  Basic metabolic panel     Status: Abnormal   Collection Time: 05/21/16  7:44 AM  Result Value Ref Range   Sodium 133 (L) 135 - 145 mmol/L   Potassium 4.0 3.5 - 5.1 mmol/L   Chloride 109 101 - 111 mmol/L   CO2 19 (L) 22 - 32 mmol/L   Glucose, Bld 77 65 - 99 mg/dL   BUN 16 6 - 20 mg/dL   Creatinine, Ser 1.09 (H) 0.44 - 1.00 mg/dL   Calcium 8.2 (L) 8.9 - 10.3 mg/dL   GFR calc non Af Amer 45 (L) >60 mL/min   GFR calc Af Amer 52 (L) >60 mL/min    Comment: (NOTE) The eGFR has been calculated using the CKD EPI equation. This calculation has not been validated in all  clinical situations. eGFR's persistently <60 mL/min signify possible Chronic Kidney Disease.    Anion gap 5 5 - 15  Glucose, capillary     Status: Abnormal   Collection Time: 05/21/16 12:06 PM  Result Value Ref Range   Glucose-Capillary 110 (H) 65 - 99 mg/dL    Current Facility-Administered Medications  Medication Dose Route Frequency Provider Last Rate Last Dose  . acetaminophen (TYLENOL) tablet 650 mg  650 mg Oral Q6H PRN Dustin Flock, MD   650 mg at 05/19/16 2149   Or  . acetaminophen (TYLENOL) suppository 650 mg  650 mg Rectal Q6H PRN Dustin Flock, MD      . ALPRAZolam Duanne Moron) tablet 0.25 mg  0.25 mg Oral TID PRN Gladstone Lighter, MD   0.25 mg at 05/19/16 1837  . aspirin EC tablet 81 mg  81 mg Oral Daily Dustin Flock, MD   81 mg at 05/21/16 0959  . calcium carbonate (TUMS - dosed in mg elemental calcium) chewable tablet 1,500 mg  1,500 mg Oral BID WC Dustin Flock, MD   1,500 mg at 05/20/16 1759  . ceFAZolin (ANCEF) IVPB 1 g/50 mL premix  1 g Intravenous Q12H Leonel Ramsay, MD   1 g at 05/21/16 0959  . cholecalciferol (VITAMIN D) tablet 1,000 Units  1,000 Units Oral Daily Shreyang Posey Pronto,  MD   1,000 Units at 05/21/16 1001  . citalopram (CELEXA) tablet 10 mg  10 mg Oral Daily Gladstone Lighter, MD   10 mg at 05/21/16 0958  . guaiFENesin (MUCINEX) 12 hr tablet 600 mg  600 mg Oral BID Dustin Flock, MD   600 mg at 05/21/16 0959   And  . dextromethorphan (DELSYM) 30 MG/5ML liquid 30 mg  30 mg Oral BID Dustin Flock, MD   30 mg at 05/21/16 1000  . enoxaparin (LOVENOX) injection 40 mg  40 mg Subcutaneous Q24H Gladstone Lighter, MD      . fluticasone (FLONASE) 50 MCG/ACT nasal spray 2 spray  2 spray Each Nare QHS Dustin Flock, MD   2 spray at 05/20/16 2059  . hydrocortisone (CORTEF) tablet 20 mg  20 mg Oral Daily Dustin Flock, MD   20 mg at 05/21/16 0958  . insulin aspart (novoLOG) injection 0-9 Units  0-9 Units Subcutaneous TID WC Dustin Flock, MD   2 Units at 05/20/16  1759  . loratadine (CLARITIN) tablet 10 mg  10 mg Oral Daily Dustin Flock, MD   10 mg at 05/21/16 0958  . MEDLINE mouth rinse  15 mL Mouth Rinse BID Dustin Flock, MD   15 mL at 05/21/16 1001  . metoprolol tartrate (LOPRESSOR) tablet 12.5 mg  12.5 mg Oral BID Gladstone Lighter, MD   12.5 mg at 05/21/16 1000  . ondansetron (ZOFRAN) tablet 4 mg  4 mg Oral Q6H PRN Dustin Flock, MD       Or  . ondansetron (ZOFRAN) injection 4 mg  4 mg Intravenous Q6H PRN Dustin Flock, MD      . polyethylene glycol (MIRALAX / GLYCOLAX) packet 17 g  17 g Oral Daily PRN Gladstone Lighter, MD   17 g at 05/20/16 0606  . pravastatin (PRAVACHOL) tablet 20 mg  20 mg Oral q1800 Dustin Flock, MD   20 mg at 05/20/16 1759  . senna-docusate (Senokot-S) tablet 2 tablet  2 tablet Oral Daily Gladstone Lighter, MD   2 tablet at 05/21/16 0959  . Travoprost (BAK Free) (TRAVATAN) 0.004 % ophthalmic solution SOLN 1 drop  1 drop Both Eyes QHS Dustin Flock, MD   1 drop at 05/20/16 2058    Musculoskeletal: Strength & Muscle Tone: decreased Gait & Station: unable to stand Patient leans: N/A  Psychiatric Specialty Exam: Physical Exam  Nursing note and vitals reviewed. Constitutional: She appears well-developed and well-nourished.  HENT:  Head: Normocephalic and atraumatic.  Eyes: Conjunctivae are normal. Pupils are equal, round, and reactive to light.  Neck: Normal range of motion.  Cardiovascular: Regular rhythm and normal heart sounds.   Respiratory: She is in respiratory distress.  GI: Soft.  Musculoskeletal: Normal range of motion.  Neurological: She is alert.  Skin: Skin is warm and dry.  Psychiatric: Her speech is delayed. She is slowed. She expresses impulsivity. She exhibits a depressed mood. She expresses suicidal ideation. She expresses no suicidal plans. She exhibits abnormal recent memory.    Review of Systems  Constitutional: Positive for malaise/fatigue.  HENT: Negative.   Eyes: Negative.    Respiratory: Positive for shortness of breath.   Cardiovascular: Negative.   Gastrointestinal: Negative.   Musculoskeletal: Negative.   Skin: Negative.   Neurological: Positive for weakness.  Psychiatric/Behavioral: Positive for depression and suicidal ideas. Negative for hallucinations, memory loss and substance abuse. The patient is not nervous/anxious and does not have insomnia.     Blood pressure (!) 130/52, pulse 94, temperature 97.6 F (36.4  C), temperature source Oral, resp. rate 16, height 5' 3"  (1.6 m), weight 74.8 kg (165 lb), SpO2 95 %.Body mass index is 29.23 kg/m.  General Appearance: Fairly Groomed  Eye Contact:  Fair  Speech:  Slow  Volume:  Decreased  Mood:  Dysphoric  Affect:  Blunt  Thought Process:  Linear  Orientation:  Full (Time, Place, and Person)  Thought Content:  Illogical  Suicidal Thoughts:  Yes.  without intent/plan  Homicidal Thoughts:  No  Memory:  Immediate;   Good Recent;   Fair Remote;   Fair  Judgement:  Impaired  Insight:  Shallow  Psychomotor Activity:  Decreased  Concentration:  Concentration: Fair  Recall:  [employer] of Knowledge:  Fair  Language:  Fair  Akathisia:  No  Handed:  Right  AIMS (if indicated):     Assets:  Desire for Improvement Housing Resilience Social Support  ADL's:  Impaired  Cognition:  Impaired,  Mild  Sleep:        Treatment Plan Summary: Daily contact with patient to assess and evaluate symptoms and progress in treatment, Medication management and Plan 83 year old woman with no past psychiatric history currently presenting with flat affect subjective feeling of depression slowed mentation and passive suicidal thoughts. Could be simply a reaction to the illness either because of her underlying personality or could represent a mild type of delirium. Could turn into a major depression but time will have to tell about that. I'm concerned that there is a possibility this could relate to her Addison's disease. I  see that she was started on a low dose of Celexa which I would agree with as a treatment plan. Does not need a sitter in the room or commitment. Not likely to need psychiatric hospitalization. I will continue to follow-up regularly. Case reviewed with the daughter and son as well.  Disposition: Patient does not meet criteria for psychiatric inpatient admission. Supportive therapy provided about ongoing stressors.  Alethia Berthold, MD 05/21/2016 1:20 PM

## 2016-05-22 LAB — GLUCOSE, CAPILLARY
Glucose-Capillary: 78 mg/dL (ref 65–99)
Glucose-Capillary: 142 mg/dL — ABNORMAL HIGH (ref 65–99)

## 2016-05-22 MED ORDER — POLYETHYLENE GLYCOL 3350 17 G PO PACK: 17.0000 g | PACK | Freq: Every day | ORAL | 0 refills | Status: AC | PRN

## 2016-05-22 MED ORDER — ENSURE ENLIVE PO LIQD: 237.0000 mL | Freq: Two times a day (BID) | ORAL | Status: DC

## 2016-05-22 MED ORDER — CEFAZOLIN SODIUM-DEXTROSE 2-4 GM/100ML-% IV SOLN: 2.0000 g | Freq: Three times a day (TID) | INTRAVENOUS | 0 refills | Status: DC

## 2016-05-22 MED ORDER — SENNOSIDES-DOCUSATE SODIUM 8.6-50 MG PO TABS: 2.0000 | ORAL_TABLET | Freq: Every day | ORAL | 0 refills | Status: AC

## 2016-05-22 MED ORDER — ENSURE ENLIVE PO LIQD: 237.0000 mL | Freq: Two times a day (BID) | ORAL | 12 refills | Status: AC

## 2016-05-22 MED ORDER — CEFAZOLIN SODIUM-DEXTROSE 2-4 GM/100ML-% IV SOLN
2.0000 g | Freq: Three times a day (TID) | INTRAVENOUS | Status: DC
  Administered 2016-05-22: 2 g via INTRAVENOUS
  Filled 2016-05-22 (×2): qty 100

## 2016-05-22 MED ORDER — ALPRAZOLAM 0.25 MG PO TABS: 0.2500 mg | ORAL_TABLET | Freq: Three times a day (TID) | ORAL | 0 refills | Status: DC | PRN

## 2016-05-22 MED ORDER — CITALOPRAM HYDROBROMIDE 10 MG PO TABS: 10.0000 mg | ORAL_TABLET | Freq: Every day | ORAL | 2 refills | Status: AC

## 2016-05-22 MED ORDER — METOPROLOL TARTRATE 50 MG PO TABS: 50.0000 mg | ORAL_TABLET | Freq: Two times a day (BID) | ORAL | 2 refills | Status: AC

## 2016-05-22 NOTE — Clinical Social Work Note (Signed)
Patient and family have decided to choose [facility]. CSW has notified Verlon AuLeslie at [facility]. Discharge information sent. Nurse to call report. Patient to transport via EMS. York SpanielMonica Kalik Hoare MSW,LCSW [phone]

## 2016-05-22 NOTE — Progress Notes (Signed)
Peripheral IV's removed, PICC line in Left arm in place. Discharge instructions, follow-up appointments, and prescriptions were provided to the pt and daughter at bedside. The pt was taken downstairs via EMS and will transport to [facility].

## 2016-05-22 NOTE — Clinical Social Work Placement (Signed)
   CLINICAL SOCIAL WORK PLACEMENT  NOTE  Date:  05/22/2016  Patient Details  Name: Jodi Best MRN: [id number] Date of Birth: [birth year]  Clinical Social Work is seeking post-discharge placement for this patient at the Skilled  Nursing Facility level of care (*CSW will initial, date and re-position this form in  chart as items are completed):  Yes   Patient/family provided with [facility] Clinical Social Work Department's list of facilities offering this level of care within the geographic area requested by the patient (or if unable, by the patient's family).  Yes   Patient/family informed of their freedom to choose among providers that offer the needed level of care, that participate in Medicare, Medicaid or managed care program needed by the patient, have an available bed and are willing to accept the patient.  Yes   Patient/family informed of [facility]'s ownership interest in [facility] and [facility], as well as of the fact that they are under no obligation to receive care at these facilities.  PASRR submitted to EDS on 05/20/16     PASRR number received on 05/20/16     Existing PASRR number confirmed on       FL2 transmitted to all facilities in geographic area requested by pt/family on       FL2 transmitted to all facilities within larger geographic area on       Patient informed that his/her managed care company has contracts with or will negotiate with certain facilities, including the following:        Yes   Patient/family informed of bed offers received.  Patient chooses bed at  [facility]     Physician recommends and patient chooses bed at  [facility]    Patient to be transferred to  [facility] on 05/22/16.  Patient to be transferred to facility by  (EMS)     Patient family notified on 05/22/16 of transfer.  Name of family member notified:  son and daughter     PHYSICIAN       Additional Comment:     _______________________________________________ York SpanielMonica Key Cen, LCSW 05/22/2016, 11:21 AM

## 2016-05-22 NOTE — Progress Notes (Signed)
Pharmacy Antibiotic Note  Jodi Best is a 83 y.o. female admitted on 05/16/2016 with bacteremia/pneumonia (MSSA).  Pharmacy has been consulted for cefazolin dosing.  This is day #5 of antibiotics, which are to continue through 06/01/16 per MD.  Plan: Given improvement in renal function, will increase dose to cefazolin 2 g IV q8h.  Height: 5\' 3"  (160 cm) Weight: 165 lb (74.8 kg) IBW/kg (Calculated) : 52.4  Temp (24hrs), Avg:98 F (36.7 C), Min:97.5 F (36.4 C), Max:98.3 F (36.8 C)   Recent Labs Lab 05/17/16 0523 05/18/16 0630 05/19/16 0445 05/20/16 0332 05/21/16 0744  WBC 25.0* 26.3* 21.5* 19.3* 14.9*  CREATININE 2.95* 1.75* 1.39* 1.35* 1.09*    Estimated Creatinine Clearance: 36.6 mL/min (by C-G formula based on SCr of 1.09 mg/dL (H)).    Allergies  Allergen Reactions  . Codeine     unlnown  . Penicillins     Has patient had a PCN reaction causing immediate rash, facial/tongue/throat swelling, SOB or lightheadedness with hypotension: no Has patient had a PCN reaction causing severe rash involving mucus membranes or skin necrosis: no Has patient had a PCN reaction that required hospitalization no Has patient had a PCN reaction occurring within the last 10 years: yes If all of the above answers are "NO", then may proceed with Cephalosporin use.     Antimicrobials this admission: levaquin 1/12 x1 Azithro 1/14 >>1/15 Ceftriaxone 1/14 >> 1/15 cefazolin 1/15 >>  Dose adjustments this admission: 1/18 cefazolin increased from 1 g IV q12h to 2 g IV q8h  Microbiology results: 1/12 BCx:  MSSA 1/14 repeat BCx: no growth to date Sputum: MSSA  Thank you for allowing pharmacy to be a part of this patient's care.  Jodi Best, PharmD, BCPS Clinical Pharmacist 05/22/2016 9:59 AM

## 2016-05-22 NOTE — Progress Notes (Signed)
Nutrition Follow-up  DOCUMENTATION CODES:   Non-severe (moderate) malnutrition in context of acute illness/injury  INTERVENTION:  Ensure Enlive po BID, each supplement provides 350 kcal and 20 grams of protein  snacks  NUTRITION DIAGNOSIS:   Malnutrition related to acute illness as evidenced by energy intake < or equal to 50% for > or equal to 5 days, moderate depletion of body fat, mild fluid accumulation.  GOAL:   Patient will meet greater than or equal to 90% of their needs  MONITOR:   Labs, Weight trends, I & O's, PO intake, Supplement acceptance  REASON FOR ASSESSMENT:   Malnutrition Screening Tool    ASSESSMENT:   Jodi Best  is a 83 y.o. female with a known history of Addison's disease, cataracts, diabetes type 2, essential hypertension, hyperlipidemia, osteoarthritis who is presenting with complaint of feeling weak and fatigue and now short of breath   Met with pt in room today. Pt reports improved appetite and eating 75% meals. RD obtained bed weight of 176lbs today. Pt with weight gain since admit. Pt reports that she has not received any of the Boost Breeze. Will order Ensure Enlive. Pt scheduled to discharge to rehab today.   Medications reviewed and include: aspirin, Ca carbonate, cefazolin, Vit D, celexa, lovenox, insulin, senokot  Labs reviewed: Na 133(L), CO2 19(L), creat 1.09(H), Ca 8.2(L) Wbc- 14.9(H), Hgb 8.6(L), Hct 26(L)  Diet Order:  Diet regular Room service appropriate? Yes; Fluid consistency: Thin Diet - low sodium heart healthy  Skin:  Wound (see comment) (Stage I to Sacrum)  Last BM:  05/09/2016  Height:   Ht Readings from Last 1 Encounters:  05/16/16 5' 3" (1.6 m)    Weight:   Wt Readings from Last 1 Encounters:  05/16/16 165 lb (74.8 kg)    Ideal Body Weight:  52.27 kg  BMI:  Body mass index is 29.23 kg/m.  Estimated Nutritional Needs:   Kcal:  1300-1500 calories (MSJ x1.1-1.2)  Protein:  75-90 grams  Fluid:  >/=  1.3L  EDUCATION NEEDS:   No education needs identified at this time  Casey Campbell, RD, LDN Pager #- [phone]  

## 2016-05-22 NOTE — Discharge Summary (Addendum)
[facility] - [place] at [facility]   PATIENT NAME: Jodi Best    MR#:  [id number]  DATE OF BIRTH:  Nov 04, 1928  DATE OF ADMISSION:  05/16/2016   ADMITTING PHYSICIAN: Auburn Bilberry, MD  DATE OF DISCHARGE: 05/22/16  PRIMARY CARE PHYSICIAN: No PCP Per Patient   ADMISSION DIAGNOSIS:   Influenza A [J10.1] Community acquired pneumonia of left lower lobe of lung (HCC) [J18.1]  DISCHARGE DIAGNOSIS:   Principal Problem:   Adjustment disorder with mixed anxiety and depressed mood Active Problems:   PNA (pneumonia)   Pressure injury of skin   SECONDARY DIAGNOSIS:   Past Medical History:  Diagnosis Date  . Addison disease (HCC)   . Cataract   . Diabetes mellitus without complication (HCC)   . Hyperlipemia   . Hypertension   . Osteoarthritis     HOSPITAL COURSE:   83 year old female with past medical history significant for Addison's disease, diabetes, hypertension, arthritis and hyperlipidemia presents from home secondary to cough and generalized weakness.  #1 acute hypoxic respiratory failure- secondary to influenza a and also superimposed left basilar pneumonia. -Patient not on home oxygen. Initially needed 5 L oxygen on admisison. - weaned to 1L  - encourage incentive spirometry -finishedTamiflu x 5 days. -Blood cultures are growing staph bacteremia- On Ancef at this time. Will need 2 weeks of antibiotics. Repeat blood cultures from 05/18/16 are negative. PICC line has been placed. -Appreciate ID consult.  - ECHO negative for any vegetations -Cough medications have been adjusted. - #2 Sepsis- secondary to pneumonia, has a staph aureus bacteremia - on ancef for 2 weeks until 06/01/16 - ABX adjusted,  wbc count-improving now  #3 acute renal failure-has known CKD, baseline creatinine around 1.3-1.5. Creatinine worse secondary to ATN from infection on admission. -Avoid nephrotoxins.  -Improved with fluids and back to baseline now.  #4  Addison's disease-continue hydrocortisone. Blood pressure is low normal, so discontinued Norvasc and decrease metoprolol dosage for now.  #5 Depression- depressed from the illness, no active suicidal ideation. Started low-dose Celexa Psych consult appreciated Improving now    Son and daughter updated at bedside Discharge to [facility] today  DISCHARGE CONDITIONS:   Guarded  CONSULTS OBTAINED:   Treatment Team:  Mick Sell, MD Audery Amel, MD  DRUG ALLERGIES:   Allergies  Allergen Reactions  . Codeine     unlnown  . Penicillins     Has patient had a PCN reaction causing immediate rash, facial/tongue/throat swelling, SOB or lightheadedness with hypotension: no Has patient had a PCN reaction causing severe rash involving mucus membranes or skin necrosis: no Has patient had a PCN reaction that required hospitalization no Has patient had a PCN reaction occurring within the last 10 years: yes If all of the above answers are "NO", then may proceed with Cephalosporin use.    DISCHARGE MEDICATIONS:   Allergies as of 05/22/2016      Reactions   Codeine    unlnown   Penicillins    Has patient had a PCN reaction causing immediate rash, facial/tongue/throat swelling, SOB or lightheadedness with hypotension: no Has patient had a PCN reaction causing severe rash involving mucus membranes or skin necrosis: no Has patient had a PCN reaction that required hospitalization no Has patient had a PCN reaction occurring within the last 10 years: yes If all of the above answers are "NO", then may proceed with Cephalosporin use.      Medication List    STOP taking these medications  acetaminophen 650 MG suppository Commonly known as:  TYLENOL   amLODipine 10 MG tablet Commonly known as:  NORVASC   furosemide 40 MG tablet Commonly known as:  LASIX   mupirocin ointment 2 % Commonly known as:  BACTROBAN   TUSSIONEX PENNKINETIC ER 10-8 MG/5ML Suer Generic drug:   chlorpheniramine-HYDROcodone   valACYclovir 1000 MG tablet Commonly known as:  VALTREX     TAKE these medications   ALPRAZolam 0.25 MG tablet Commonly known as:  XANAX Take 1 tablet (0.25 mg total) by mouth 3 (three) times daily as needed for anxiety.   aspirin EC 81 MG tablet Take 81 mg by mouth daily.   calcium carbonate 1500 (600 Ca) MG Tabs tablet Commonly known as:  OSCAL Take 1,500 mg by mouth 2 (two) times daily with a meal.   ceFAZolin 2-4 GM/100ML-% IVPB Commonly known as:  ANCEF Inject 100 mLs (2 g total) into the vein every 8 (eight) hours. Until 06/01/16   cetirizine 10 MG tablet Commonly known as:  ZYRTEC Take 10 mg by mouth daily.   Cholecalciferol 1000 units capsule Take 1,000 Units by mouth daily.   citalopram 10 MG tablet Commonly known as:  CELEXA Take 1 tablet (10 mg total) by mouth daily.   CORTEF 20 MG tablet Generic drug:  hydrocortisone Take 20 mg by mouth daily.   dextromethorphan-guaiFENesin 30-600 MG 12hr tablet Commonly known as:  MUCINEX DM Take 1 tablet by mouth 2 (two) times daily.   feeding supplement (ENSURE ENLIVE) Liqd Take 237 mLs by mouth 2 (two) times daily between meals.   fluticasone 50 MCG/ACT nasal spray Commonly known as:  FLONASE Place 2 sprays into both nostrils at bedtime.   lovastatin 40 MG tablet Commonly known as:  MEVACOR Take 40 mg by mouth at bedtime.   metFORMIN 500 MG tablet Commonly known as:  GLUCOPHAGE Take by mouth 2 (two) times daily with a meal.   metoprolol 50 MG tablet Commonly known as:  LOPRESSOR Take 1 tablet (50 mg total) by mouth 2 (two) times daily. What changed:  how much to take   polyethylene glycol packet Commonly known as:  MIRALAX / GLYCOLAX Take 17 g by mouth daily as needed for mild constipation.   senna-docusate 8.6-50 MG tablet Commonly known as:  Senokot-S Take 2 tablets by mouth daily.   travoprost (benzalkonium) 0.004 % ophthalmic solution Commonly known as:   TRAVATAN 1 drop at bedtime.        DISCHARGE INSTRUCTIONS:   1. PCP f/u in 1-2 weeks 2. F/u with ID in 10 days  DIET:   Cardiac diet  ACTIVITY:   Activity as tolerated  OXYGEN:   Home Oxygen: Yes.    Oxygen Delivery: 1 liters/min via Patient connected to nasal cannula oxygen  DISCHARGE LOCATION:   nursing home   If you experience worsening of your admission symptoms, develop shortness of breath, life threatening emergency, suicidal or homicidal thoughts you must seek medical attention immediately by calling 911 or calling your MD immediately  if symptoms less severe.  You Must read complete instructions/literature along with all the possible adverse reactions/side effects for all the Medicines you take and that have been prescribed to you. Take any new Medicines after you have completely understood and accpet all the possible adverse reactions/side effects.   Please note  You were cared for by a hospitalist during your hospital stay. If you have any questions about your discharge medications or the care you received while you were  in the hospital after you are discharged, you can call the unit and asked to speak with the hospitalist on call if the hospitalist that took care of you is not available. Once you are discharged, your primary care physician will handle any further medical issues. Please note that NO REFILLS for any discharge medications will be authorized once you are discharged, as it is imperative that you return to your primary care physician (or establish a relationship with a primary care physician if you do not have one) for your aftercare needs so that they can reassess your need for medications and monitor your lab values.    On the day of Discharge:  VITAL SIGNS:   Blood pressure (!) 166/74, pulse 95, temperature 98.3 F (36.8 C), resp. rate 16, height 5\' 3"  (1.6 m), weight 74.8 kg (165 lb), SpO2 97 %.  PHYSICAL EXAMINATION:    GENERAL:  83  y.o.-year-old patient lying in the bed with no acute distress.  EYES: Pupils equal, round, reactive to light and accommodation. No scleral icterus. Extraocular muscles intact.  HEENT: Head atraumatic, normocephalic. Oropharynx and nasopharynx clear.  NECK:  Supple, no jugular venous distention. No thyroid enlargement, no tenderness.  LUNGS: Normal breath sounds bilaterally, no wheezing, rales or crepitation. No use of accessory muscles of respiration. Improved Bibasilar rhonchi CARDIOVASCULAR: S1, S2 normal. No  rubs, or gallops. 2/6 systolic murmur present ABDOMEN: Soft, nontender, nondistended. Bowel sounds present. No organomegaly or mass.  EXTREMITIES: No pedal edema, cyanosis, or clubbing.  NEUROLOGIC: Cranial nerves II through XII are intact. Muscle strength 5/5 in all extremities. Sensation intact. Gait not checked. Global  weakness noted PSYCHIATRIC: The patient is alert and oriented x 3. Improved affect today SKIN: No obvious rash, lesion, or ulcer.    DATA REVIEW:   CBC  Recent Labs Lab 05/21/16 0744  WBC 14.9*  HGB 8.6*  HCT 26.0*  PLT 345    Chemistries   Recent Labs Lab 05/16/16 0909  05/21/16 0744  NA 128*  < > 133*  K 4.7  < > 4.0  CL 96*  < > 109  CO2 19*  < > 19*  GLUCOSE 148*  < > 77  BUN 59*  < > 16  CREATININE 2.75*  < > 1.09*  CALCIUM 8.9  < > 8.2*  AST 22  --   --   ALT 11*  --   --   ALKPHOS 92  --   --   BILITOT 1.0  --   --   < > = values in this interval not displayed.   Microbiology Results  Results for orders placed or performed during the hospital encounter of 05/16/16  Culture, blood (Routine X 2) w Reflex to ID Panel     Status: Abnormal   Collection Time: 05/16/16 10:01 AM  Result Value Ref Range Status   Specimen Description BLOOD RIGHT ASSIST CONTROL  Final   Special Requests BOTTLES DRAWN AEROBIC AND ANAEROBIC AER5ML ANA6ML  Final   Culture  Setup Time   Final    GRAM POSITIVE COCCI ANAEROBIC BOTTLE ONLY CRITICAL RESULT  CALLED TO, READ BACK BY AND VERIFIED WITH: MICHAEL SIMPSON AT 1935 05/17/13 SDR [facility]    Culture STAPHYLOCOCCUS AUREUS (A)  Final   Report Status 05/20/2016 FINAL  Final   Organism ID, Bacteria STAPHYLOCOCCUS AUREUS  Final      Susceptibility   Staphylococcus aureus - MIC*    CIPROFLOXACIN <=0.5 SENSITIVE Sensitive  ERYTHROMYCIN >=8 RESISTANT Resistant     GENTAMICIN <=0.5 SENSITIVE Sensitive     OXACILLIN 0.5 SENSITIVE Sensitive     TETRACYCLINE <=1 SENSITIVE Sensitive     VANCOMYCIN <=0.5 SENSITIVE Sensitive     TRIMETH/SULFA <=10 SENSITIVE Sensitive     CLINDAMYCIN <=0.25 RESISTANT Resistant     RIFAMPIN <=0.5 SENSITIVE Sensitive     Inducible Clindamycin POSITIVE Resistant     * STAPHYLOCOCCUS AUREUS  Culture, blood (Routine X 2) w Reflex to ID Panel     Status: None   Collection Time: 05/16/16 10:01 AM  Result Value Ref Range Status   Specimen Description BLOOD LEFT HAND  Final   Special Requests BOTTLES DRAWN AEROBIC AND ANAEROBIC ANA5ML AER6ML  Final   Culture NO GROWTH 5 DAYS  Final   Report Status 05/21/2016 FINAL  Final  Blood Culture ID Panel (Reflexed)     Status: Abnormal   Collection Time: 05/16/16 10:01 AM  Result Value Ref Range Status   Enterococcus species NOT DETECTED NOT DETECTED Final   Listeria monocytogenes NOT DETECTED NOT DETECTED Final   Staphylococcus species DETECTED (A) NOT DETECTED Final    Comment: CRITICAL RESULT CALLED TO, READ BACK BY AND VERIFIED WITH:  MICHAEL SIMPSON AT 1935 05/17/16 SDR    Staphylococcus aureus DETECTED (A) NOT DETECTED Final    Comment: CRITICAL RESULT CALLED TO, READ BACK BY AND VERIFIED WITH:  MICHAEL SIMPSON AT 1935 05/17/16 SDR    Methicillin resistance NOT DETECTED NOT DETECTED Final   Streptococcus species NOT DETECTED NOT DETECTED Final   Streptococcus agalactiae NOT DETECTED NOT DETECTED Final   Streptococcus pneumoniae NOT DETECTED NOT DETECTED Final   Streptococcus pyogenes NOT  DETECTED NOT DETECTED Final   Acinetobacter baumannii NOT DETECTED NOT DETECTED Final   Enterobacteriaceae species NOT DETECTED NOT DETECTED Final   Enterobacter cloacae complex NOT DETECTED NOT DETECTED Final   Escherichia coli NOT DETECTED NOT DETECTED Final   Klebsiella oxytoca NOT DETECTED NOT DETECTED Final   Klebsiella pneumoniae NOT DETECTED NOT DETECTED Final   Proteus species NOT DETECTED NOT DETECTED Final   Serratia marcescens NOT DETECTED NOT DETECTED Final   Haemophilus influenzae NOT DETECTED NOT DETECTED Final   Neisseria meningitidis NOT DETECTED NOT DETECTED Final   Pseudomonas aeruginosa NOT DETECTED NOT DETECTED Final   Candida albicans NOT DETECTED NOT DETECTED Final   Candida glabrata NOT DETECTED NOT DETECTED Final   Candida krusei NOT DETECTED NOT DETECTED Final   Candida parapsilosis NOT DETECTED NOT DETECTED Final   Candida tropicalis NOT DETECTED NOT DETECTED Final  Culture, expectorated sputum-assessment     Status: None   Collection Time: 05/16/16  4:06 PM  Result Value Ref Range Status   Specimen Description SPUTUM  Final   Special Requests Normal  Final   Sputum evaluation THIS SPECIMEN IS ACCEPTABLE FOR SPUTUM CULTURE  Final   Report Status 05/16/2016 FINAL  Final  Culture, respiratory (NON-Expectorated)     Status: None   Collection Time: 05/16/16  4:06 PM  Result Value Ref Range Status   Specimen Description SPUTUM  Final   Special Requests Normal Reflexed from [id number]  Final   Gram Stain   Final    ABUNDANT WBC PRESENT, PREDOMINANTLY PMN MODERATE GRAM POSITIVE COCCI IN PAIRS FEW GRAM NEGATIVE COCCOBACILLI RARE GRAM NEGATIVE RODS Performed at [facility]    Culture FEW STAPHYLOCOCCUS AUREUS  Final   Report Status 05/19/2016 FINAL  Final   Organism ID, Bacteria STAPHYLOCOCCUS AUREUS  Final  Susceptibility   Staphylococcus aureus - MIC*    CIPROFLOXACIN <=0.5 SENSITIVE Sensitive     ERYTHROMYCIN >=8 RESISTANT Resistant      GENTAMICIN <=0.5 SENSITIVE Sensitive     OXACILLIN 0.5 SENSITIVE Sensitive     TETRACYCLINE <=1 SENSITIVE Sensitive     VANCOMYCIN <=0.5 SENSITIVE Sensitive     TRIMETH/SULFA <=10 SENSITIVE Sensitive     CLINDAMYCIN <=0.25 RESISTANT Resistant     RIFAMPIN <=0.5 SENSITIVE Sensitive     Inducible Clindamycin POSITIVE Resistant     * FEW STAPHYLOCOCCUS AUREUS  Culture, blood (single) w Reflex to ID Panel     Status: None (Preliminary result)   Collection Time: 05/18/16  1:05 PM  Result Value Ref Range Status   Specimen Description BLOOD LEFT HAND  Final   Special Requests BOTTLES DRAWN AEROBIC AND ANAEROBIC ANA8ML AER7ML  Final   Culture NO GROWTH 3 DAYS  Final   Report Status PENDING  Incomplete    RADIOLOGY:  No results found.   Management plans discussed with the patient, family and they are in agreement.  CODE STATUS:     Code Status Orders        Start     Ordered   05/16/16 1210  Full code  Continuous     05/16/16 1209    Code Status History    Date Active Date Inactive Code Status Order ID Comments User Context   This patient has a current code status but no historical code status.      TOTAL TIME TAKING CARE OF THIS PATIENT: 37 minutes.    Enid Baas M.D on 05/22/2016 at 10:46 AM  Between 7am to 6pm - Pager - [phone]  After 6pm go to [email or web address] - Scientist, research (life sciences) [facility]  Office  [phone]  CC: Primary care physician; No PCP Per Patient   Note: This dictation was prepared with Dragon dictation along with smaller phrase technology. Any transcriptional errors that result from this process are unintentional.

## 2016-05-23 LAB — CULTURE, BLOOD (SINGLE): CULTURE: NO GROWTH

## 2016-07-06 ENCOUNTER — Encounter: Payer: Self-pay | Admitting: Emergency Medicine

## 2016-07-06 ENCOUNTER — Emergency Department: Payer: Medicare Other

## 2016-07-06 ENCOUNTER — Inpatient Hospital Stay
Admission: EM | Admit: 2016-07-06 | Discharge: 2016-07-10 | DRG: 640 | Disposition: A | Discharge status: Skilled Nursing Facility | Payer: Medicare Other | Attending: Internal Medicine | Admitting: Internal Medicine

## 2016-07-06 DIAGNOSIS — E861 Hypovolemia: Secondary | ICD-10-CM | POA: Diagnosis present

## 2016-07-06 DIAGNOSIS — R410 Disorientation, unspecified: Secondary | ICD-10-CM

## 2016-07-06 DIAGNOSIS — N183 Chronic kidney disease, stage 3 (moderate): Secondary | ICD-10-CM | POA: Diagnosis present

## 2016-07-06 DIAGNOSIS — E785 Hyperlipidemia, unspecified: Secondary | ICD-10-CM | POA: Diagnosis present

## 2016-07-06 DIAGNOSIS — E669 Obesity, unspecified: Secondary | ICD-10-CM | POA: Diagnosis present

## 2016-07-06 DIAGNOSIS — G9341 Metabolic encephalopathy: Secondary | ICD-10-CM | POA: Diagnosis not present

## 2016-07-06 DIAGNOSIS — Z7951 Long term (current) use of inhaled steroids: Secondary | ICD-10-CM

## 2016-07-06 DIAGNOSIS — R55 Syncope and collapse: Secondary | ICD-10-CM | POA: Diagnosis not present

## 2016-07-06 DIAGNOSIS — F329 Major depressive disorder, single episode, unspecified: Secondary | ICD-10-CM | POA: Diagnosis present

## 2016-07-06 DIAGNOSIS — Z7984 Long term (current) use of oral hypoglycemic drugs: Secondary | ICD-10-CM

## 2016-07-06 DIAGNOSIS — R9431 Abnormal electrocardiogram [ECG] [EKG]: Secondary | ICD-10-CM

## 2016-07-06 DIAGNOSIS — I129 Hypertensive chronic kidney disease with stage 1 through stage 4 chronic kidney disease, or unspecified chronic kidney disease: Secondary | ICD-10-CM | POA: Diagnosis present

## 2016-07-06 DIAGNOSIS — N3000 Acute cystitis without hematuria: Secondary | ICD-10-CM | POA: Diagnosis present

## 2016-07-06 DIAGNOSIS — E871 Hypo-osmolality and hyponatremia: Secondary | ICD-10-CM | POA: Diagnosis not present

## 2016-07-06 DIAGNOSIS — E1122 Type 2 diabetes mellitus with diabetic chronic kidney disease: Secondary | ICD-10-CM | POA: Diagnosis present

## 2016-07-06 DIAGNOSIS — E271 Primary adrenocortical insufficiency: Secondary | ICD-10-CM | POA: Diagnosis present

## 2016-07-06 DIAGNOSIS — T502X5A Adverse effect of carbonic-anhydrase inhibitors, benzothiadiazides and other diuretics, initial encounter: Secondary | ICD-10-CM | POA: Diagnosis present

## 2016-07-06 DIAGNOSIS — Z7982 Long term (current) use of aspirin: Secondary | ICD-10-CM

## 2016-07-06 DIAGNOSIS — Z7952 Long term (current) use of systemic steroids: Secondary | ICD-10-CM

## 2016-07-06 DIAGNOSIS — E876 Hypokalemia: Secondary | ICD-10-CM | POA: Diagnosis present

## 2016-07-06 DIAGNOSIS — N390 Urinary tract infection, site not specified: Secondary | ICD-10-CM

## 2016-07-06 DIAGNOSIS — Z87440 Personal history of urinary (tract) infections: Secondary | ICD-10-CM

## 2016-07-06 DIAGNOSIS — Z6828 Body mass index (BMI) 28.0-28.9, adult: Secondary | ICD-10-CM

## 2016-07-06 DIAGNOSIS — E860 Dehydration: Secondary | ICD-10-CM | POA: Diagnosis present

## 2016-07-06 LAB — CBC
HEMATOCRIT: 33.1 % — AB (ref 35.0–47.0)
Hemoglobin: 10.5 g/dL — ABNORMAL LOW (ref 12.0–16.0)
MCH: 27.3 pg (ref 26.0–34.0)
MCHC: 31.8 g/dL — ABNORMAL LOW (ref 32.0–36.0)
MCV: 85.7 fL (ref 80.0–100.0)
Platelets: 410 10*3/uL (ref 150–440)
RBC: 3.86 MIL/uL (ref 3.80–5.20)
RDW: 17.6 % — AB (ref 11.5–14.5)
WBC: 13.6 10*3/uL — ABNORMAL HIGH (ref 3.6–11.0)

## 2016-07-06 LAB — URINALYSIS, COMPLETE (UACMP) WITH MICROSCOPIC
BILIRUBIN URINE: NEGATIVE
Glucose, UA: NEGATIVE mg/dL
Hgb urine dipstick: NEGATIVE
Ketones, ur: NEGATIVE mg/dL
Nitrite: NEGATIVE
PROTEIN: 30 mg/dL — AB
RBC / HPF: NONE SEEN RBC/hpf (ref 0–5)
SQUAMOUS EPITHELIAL / LPF: NONE SEEN
Specific Gravity, Urine: 1.013 (ref 1.005–1.030)
pH: 5 (ref 5.0–8.0)

## 2016-07-06 LAB — GLUCOSE, CAPILLARY
GLUCOSE-CAPILLARY: 119 mg/dL — AB (ref 65–99)
GLUCOSE-CAPILLARY: 120 mg/dL — AB (ref 65–99)

## 2016-07-06 LAB — BASIC METABOLIC PANEL
Anion gap: 12 (ref 5–15)
BUN: 14 mg/dL (ref 6–20)
CHLORIDE: 98 mmol/L — AB (ref 101–111)
CO2: 18 mmol/L — ABNORMAL LOW (ref 22–32)
Calcium: 7.8 mg/dL — ABNORMAL LOW (ref 8.9–10.3)
Creatinine, Ser: 1.33 mg/dL — ABNORMAL HIGH (ref 0.44–1.00)
GFR calc Af Amer: 41 mL/min — ABNORMAL LOW (ref >60)
GFR calc non Af Amer: 35 mL/min — ABNORMAL LOW (ref >60)
Glucose, Bld: 92 mg/dL (ref 65–99)
POTASSIUM: 3.1 mmol/L — AB (ref 3.5–5.1)
SODIUM: 128 mmol/L — AB (ref 135–145)

## 2016-07-06 LAB — MAGNESIUM: MAGNESIUM: 1.1 mg/dL — AB (ref 1.7–2.4)

## 2016-07-06 MED ORDER — VITAMIN D 1000 UNITS PO TABS
1000.0000 [IU] | ORAL_TABLET | Freq: Every day | ORAL | Status: DC
  Administered 2016-07-07 – 2016-07-10 (×4): 1000 [IU] via ORAL
  Filled 2016-07-06 (×6): qty 1

## 2016-07-06 MED ORDER — SENNOSIDES-DOCUSATE SODIUM 8.6-50 MG PO TABS
2.0000 | ORAL_TABLET | Freq: Every day | ORAL | Status: DC
  Administered 2016-07-07 – 2016-07-10 (×4): 2 via ORAL
  Filled 2016-07-06 (×4): qty 2

## 2016-07-06 MED ORDER — CEFTRIAXONE SODIUM 1 G IJ SOLR
1.0000 g | INTRAMUSCULAR | Status: DC
  Administered 2016-07-07: 1 g via INTRAVENOUS
  Filled 2016-07-06 (×2): qty 10

## 2016-07-06 MED ORDER — SODIUM CHLORIDE 0.9 % IV BOLUS (SEPSIS)
500.0000 mL | Freq: Once | INTRAVENOUS | Status: AC
  Administered 2016-07-06: 500 mL via INTRAVENOUS

## 2016-07-06 MED ORDER — CEFTRIAXONE SODIUM-DEXTROSE 1-3.74 GM-% IV SOLR
1.0000 g | Freq: Once | INTRAVENOUS | Status: AC
  Administered 2016-07-06: 1 g via INTRAVENOUS
  Filled 2016-07-06: qty 50

## 2016-07-06 MED ORDER — HYDROCORTISONE 20 MG PO TABS
20.0000 mg | ORAL_TABLET | Freq: Every day | ORAL | Status: DC
  Administered 2016-07-07: 20 mg via ORAL
  Filled 2016-07-06: qty 1

## 2016-07-06 MED ORDER — DOCUSATE SODIUM 100 MG PO CAPS
100.0000 mg | ORAL_CAPSULE | Freq: Two times a day (BID) | ORAL | Status: DC | PRN
  Administered 2016-07-07: 100 mg via ORAL
  Filled 2016-07-06: qty 1

## 2016-07-06 MED ORDER — ENSURE ENLIVE PO LIQD
237.0000 mL | Freq: Two times a day (BID) | ORAL | Status: DC
  Administered 2016-07-07 – 2016-07-08 (×2): 237 mL via ORAL

## 2016-07-06 MED ORDER — DEXTROSE 5 % IV SOLN: 1.0000 g | INTRAVENOUS | Status: DC

## 2016-07-06 MED ORDER — INSULIN ASPART 100 UNIT/ML ~~LOC~~ SOLN
0.0000 [IU] | Freq: Three times a day (TID) | SUBCUTANEOUS | Status: DC
  Administered 2016-07-07 – 2016-07-08 (×2): 3 [IU] via SUBCUTANEOUS
  Administered 2016-07-08 (×2): 2 [IU] via SUBCUTANEOUS
  Administered 2016-07-09: 3 [IU] via SUBCUTANEOUS
  Administered 2016-07-10: 2 [IU] via SUBCUTANEOUS
  Filled 2016-07-06: qty 3
  Filled 2016-07-06: qty 2
  Filled 2016-07-06: qty 3
  Filled 2016-07-06 (×2): qty 2

## 2016-07-06 MED ORDER — DEXTROMETHORPHAN POLISTIREX ER 30 MG/5ML PO SUER
30.0000 mg | Freq: Two times a day (BID) | ORAL | Status: DC | PRN
  Filled 2016-07-06: qty 5

## 2016-07-06 MED ORDER — CALCIUM CARBONATE 1500 (600 CA) MG PO TABS
1500.0000 mg | ORAL_TABLET | Freq: Two times a day (BID) | ORAL | Status: DC
  Administered 2016-07-07: 1500 mg via ORAL
  Filled 2016-07-06 (×2): qty 1

## 2016-07-06 MED ORDER — TRAVOPROST 0.004 % OP SOLN
1.0000 [drp] | Freq: Every day | OPHTHALMIC | Status: DC
  Administered 2016-07-08 – 2016-07-09 (×2): 1 [drp] via OPHTHALMIC
  Filled 2016-07-06: qty 5

## 2016-07-06 MED ORDER — MAGNESIUM SULFATE 2 GM/50ML IV SOLN
2.0000 g | Freq: Once | INTRAVENOUS | Status: AC
  Administered 2016-07-06: 2 g via INTRAVENOUS
  Filled 2016-07-06: qty 50

## 2016-07-06 MED ORDER — CITALOPRAM HYDROBROMIDE 20 MG PO TABS
10.0000 mg | ORAL_TABLET | Freq: Every day | ORAL | Status: DC
  Administered 2016-07-07 – 2016-07-10 (×4): 10 mg via ORAL
  Filled 2016-07-06: qty 2
  Filled 2016-07-06 (×3): qty 1

## 2016-07-06 MED ORDER — ASPIRIN EC 81 MG PO TBEC
81.0000 mg | DELAYED_RELEASE_TABLET | Freq: Every day | ORAL | Status: DC
  Administered 2016-07-07 – 2016-07-10 (×4): 81 mg via ORAL
  Filled 2016-07-06 (×4): qty 1

## 2016-07-06 MED ORDER — POTASSIUM CHLORIDE IN NACL 40-0.9 MEQ/L-% IV SOLN
INTRAVENOUS | Status: DC
  Administered 2016-07-06: 75 mL/h via INTRAVENOUS
  Filled 2016-07-06: qty 1000

## 2016-07-06 MED ORDER — PRAVASTATIN SODIUM 40 MG PO TABS
40.0000 mg | ORAL_TABLET | Freq: Every day | ORAL | Status: DC
  Administered 2016-07-07 – 2016-07-09 (×3): 40 mg via ORAL
  Filled 2016-07-06 (×3): qty 1

## 2016-07-06 MED ORDER — HEPARIN SODIUM (PORCINE) 5000 UNIT/ML IJ SOLN
5000.0000 [IU] | Freq: Three times a day (TID) | INTRAMUSCULAR | Status: DC
  Administered 2016-07-06 – 2016-07-10 (×11): 5000 [IU] via SUBCUTANEOUS
  Filled 2016-07-06 (×10): qty 1

## 2016-07-06 MED ORDER — POLYETHYLENE GLYCOL 3350 17 G PO PACK: 17.0000 g | PACK | Freq: Every day | ORAL | Status: DC | PRN

## 2016-07-06 MED ORDER — GUAIFENESIN ER 600 MG PO TB12: 600.0000 mg | ORAL_TABLET | Freq: Two times a day (BID) | ORAL | Status: DC | PRN

## 2016-07-06 MED ORDER — CEFTRIAXONE SODIUM 1 G IJ SOLR: 1.0000 g | Freq: Once | INTRAMUSCULAR | Status: DC

## 2016-07-06 MED ORDER — FLUTICASONE PROPIONATE 50 MCG/ACT NA SUSP
2.0000 | Freq: Every day | NASAL | Status: DC
  Administered 2016-07-06 – 2016-07-09 (×3): 2 via NASAL
  Filled 2016-07-06 (×2): qty 16

## 2016-07-06 MED ORDER — ALPRAZOLAM 0.25 MG PO TABS
0.2500 mg | ORAL_TABLET | Freq: Three times a day (TID) | ORAL | Status: DC | PRN
  Administered 2016-07-07 – 2016-07-08 (×2): 0.25 mg via ORAL
  Filled 2016-07-06 (×3): qty 1

## 2016-07-06 MED ORDER — SODIUM CHLORIDE 0.9% FLUSH
3.0000 mL | Freq: Two times a day (BID) | INTRAVENOUS | Status: DC
  Administered 2016-07-06 – 2016-07-09 (×6): 3 mL via INTRAVENOUS

## 2016-07-06 MED ORDER — POTASSIUM CHLORIDE CRYS ER 20 MEQ PO TBCR
40.0000 meq | EXTENDED_RELEASE_TABLET | Freq: Once | ORAL | Status: AC
  Administered 2016-07-06: 40 meq via ORAL
  Filled 2016-07-06: qty 2

## 2016-07-06 MED ORDER — DM-GUAIFENESIN ER 30-600 MG PO TB12: 1.0000 | ORAL_TABLET | Freq: Two times a day (BID) | ORAL | Status: DC

## 2016-07-06 NOTE — Care Management Obs Status (Signed)
MEDICARE OBSERVATION STATUS NOTIFICATION   Patient Details  Name: Jodi Best MRN: [id number] Date of Birth: [birth year]   Medicare Observation Status Notification Given:  Yes    Caren MacadamMichelle Jacarri Gesner, RN 07/06/2016, 6:18 PM

## 2016-07-06 NOTE — ED Provider Notes (Addendum)
[facility] Emergency Department Provider Note  ____________________________________________   I have reviewed the triage vital signs and the nursing notes.   HISTORY  Chief Complaint Near Syncope    HPI Jodi Best is a 83 y.o. female presents today complaining of passing out. Her friend who is with her states that they were in the house and she was on the bathroom. Patient reports she was having a normal bowel movement. The friend stepped away from him and came back and found the patient unconscious on the toilet. She did not fall or hit her head. There was no seizure activity. She woke up after a brief period of time. She was not postictal. The patient states she has no pain from the event. She does state that she had a pneumonia in January and feels that she is still coughing. She did not recent chest x-ray and was told that the pneumonia had resolved. She denies any fever or chills. She denies any nausea vomiting or diarrhea.      Past Medical History:  Diagnosis Date  . Addison disease (HCC)   . Cataract   . Diabetes mellitus without complication (HCC)   . Hyperlipemia   . Hypertension   . Osteoarthritis     Patient Active Problem List   Diagnosis Date Noted  . Adjustment disorder with mixed anxiety and depressed mood 05/21/2016  . Pressure injury of skin 05/17/2016  . PNA (pneumonia) 05/16/2016    Past Surgical History:  Procedure Laterality Date  . CORNEAL TRANSPLANT    . EYE SURGERY    . LUMBAR LAMINECTOMY    . WRIST SURGERY      Prior to Admission medications   Medication Sig Start Date End Date Taking? Authorizing Provider  ALPRAZolam (XANAX) 0.25 MG tablet Take 1 tablet (0.25 mg total) by mouth 3 (three) times daily as needed for anxiety. 05/22/16   Enid Baasadhika [name], MD  aspirin EC 81 MG tablet Take 81 mg by mouth daily.    Historical Provider, MD  calcium carbonate (OSCAL) 1500 (600 Ca) MG TABS tablet Take 1,500 mg by mouth 2  (two) times daily with a meal.    Historical Provider, MD  ceFAZolin (ANCEF) 2-4 GM/100ML-% IVPB Inject 100 mLs (2 g total) into the vein every 8 (eight) hours. Until 06/01/16 05/22/16   Enid Baasadhika [name], MD  cetirizine (ZYRTEC) 10 MG tablet Take 10 mg by mouth daily.    Historical Provider, MD  Cholecalciferol 1000 UNITS capsule Take 1,000 Units by mouth daily.    Historical Provider, MD  citalopram (CELEXA) 10 MG tablet Take 1 tablet (10 mg total) by mouth daily. 05/22/16   Enid Baasadhika [name], MD  dextromethorphan-guaiFENesin (MUCINEX DM) 30-600 MG 12hr tablet Take 1 tablet by mouth 2 (two) times daily.    Historical Provider, MD  feeding supplement, ENSURE ENLIVE, (ENSURE ENLIVE) LIQD Take 237 mLs by mouth 2 (two) times daily between meals. 05/22/16   Enid Baasadhika [name], MD  fluticasone (FLONASE) 50 MCG/ACT nasal spray Place 2 sprays into both nostrils at bedtime.     Historical Provider, MD  hydrocortisone (CORTEF) 20 MG tablet Take 20 mg by mouth daily.    Historical Provider, MD  lovastatin (MEVACOR) 40 MG tablet Take 40 mg by mouth at bedtime.    Historical Provider, MD  metFORMIN (GLUCOPHAGE) 500 MG tablet Take by mouth 2 (two) times daily with a meal.    Historical Provider, MD  metoprolol (LOPRESSOR) 50 MG tablet Take 1 tablet (50 mg  total) by mouth 2 (two) times daily. 05/22/16   Enid Baas, MD  polyethylene glycol (MIRALAX / GLYCOLAX) packet Take 17 g by mouth daily as needed for mild constipation. 05/22/16   Enid Baas, MD  senna-docusate (SENOKOT-S) 8.6-50 MG tablet Take 2 tablets by mouth daily. 05/22/16   Enid Baas, MD  travoprost, benzalkonium, (TRAVATAN) 0.004 % ophthalmic solution 1 drop at bedtime.    Historical Provider, MD    Allergies Codeine and Penicillins  Family History  Problem Relation Age of Onset  . Hypertension Mother     Social History Social History  Substance Use Topics  . Smoking status: Never Smoker  . Smokeless tobacco: Never Used  .  Alcohol use No    Review of Systems Constitutional: No fever/chills Eyes: No visual changes. ENT: No sore throat. No stiff neck no neck pain Cardiovascular: Denies chest pain. Respiratory: Denies shortness of breath.Positive productive cough Gastrointestinal:   no vomiting.  No diarrhea.  No constipation. Genitourinary: Negative for dysuria. Musculoskeletal: Negative lower extremity swelling Skin: Negative for rash. Neurological: Negative for severe headaches, focal weakness or numbness. 10-point ROS otherwise negative.  ____________________________________________   PHYSICAL EXAM:  VITAL SIGNS: ED Triage Vitals [07/06/16 1504]  Enc Vitals Group     BP (!) 103/55     Pulse Rate 83     Resp 19     Temp 97.6 F (36.4 C)     Temp Source Oral     SpO2 97 %     Weight 167 lb (75.8 kg)     Height 5\' 3"  (1.6 m)     Head Circumference      Peak Flow      Pain Score      Pain Loc      Pain Edu?      Excl. in GC?     Constitutional: Alert and oriented. Well appearing and in no acute distress. Eyes: Conjunctivae are normal. PERRL. EOMI. Head: Atraumatic. Nose: No congestion/rhinnorhea. Mouth/Throat: Mucous membranes are moist.  Oropharynx non-erythematous. Neck: No stridor.   Nontender with no meningismus Cardiovascular: Normal rate, regular rhythm. Grossly normal heart sounds.  Good peripheral circulation. Respiratory: Normal respiratory effort.  No retractions. Lungs CTAB. Abdominal: Soft and nontender. No distention. No guarding no rebound Back:  There is no focal tenderness or step off.  there is no midline tenderness there are no lesions noted. there is no CVA tenderness Musculoskeletal: No lower extremity tenderness, no upper extremity tenderness. No joint effusions, no DVT signs strong distal pulses no edema Neurologic:  Normal speech and language. No gross focal neurologic deficits are appreciated.  Skin:  Skin is warm, dry and intact. No rash noted. Psychiatric:  Mood and affect are normal. Speech and behavior are normal.  ____________________________________________   LABS (all labs ordered are listed, but only abnormal results are displayed)  Labs Reviewed  BASIC METABOLIC PANEL - Abnormal; Notable for the following:       Result Value   Sodium 128 (*)    Potassium 3.1 (*)    Chloride 98 (*)    CO2 18 (*)    Creatinine, Ser 1.33 (*)    Calcium 7.8 (*)    GFR calc non Af Amer 35 (*)    GFR calc Af Amer 41 (*)    All other components within normal limits  CBC - Abnormal; Notable for the following:    WBC 13.6 (*)    Hemoglobin 10.5 (*)    HCT 33.1 (*)  MCHC 31.8 (*)    RDW 17.6 (*)    All other components within normal limits  URINALYSIS, COMPLETE (UACMP) WITH MICROSCOPIC  MAGNESIUM  CBG MONITORING, ED   ____________________________________________  EKG  I personally interpreted any EKGs ordered by me or triage Sinus rhythm rate 81 bpm no acute ST elevation or depression normal axis, long QTc of 550 noted. No significant ST changes noted laterally ____________________________________________  RADIOLOGY  I reviewed any imaging ordered by me or triage that were performed during my shift and, if possible, patient and/or family made aware of any abnormal findings. ____________________________________________   PROCEDURES  Procedure(s) performed: None  Procedures  Critical Care performed: None  ____________________________________________   INITIAL IMPRESSION / ASSESSMENT AND PLAN / ED COURSE  Pertinent labs & imaging results that were available during my care of the patient were reviewed by me and considered in my medical decision making (see chart for details).  Patient had a syncopal event today. She has been coughing. X-ray x-ray shows that there is either a new or a persistent area of mild infiltrate. This is in the same area of her prior pneumonia. Unclear if the patient has a new infection. She has had a  persistent cough. The patient is in no acute distress at this time. I am concerned however about her long QTC and her syncope. I have considered a broad spectrum workup. She is somewhat hyponatremic. Her giving her gentle hydration. Patient likely will require admission for further evaluation  ----------------------------------------- 5:20 PM on 07/06/2016 -----------------------------------------  He seems low, we are repleting it. Patient breathing comfortably with good sats. Hospitals like to defer antibiotics at this time. We are repeating her potassium as well. Giving IV fluids for low sodium and she will require admission.  ----------------------------------------- 5:30 PM on 07/06/2016 -----------------------------------------  Pt with uti. Will give rocephin.  Has tolerated ancef during last admission despite pcn allergy.    ____________________________________________   FINAL CLINICAL IMPRESSION(S) / ED DIAGNOSES  Final diagnoses:  None      This chart was dictated using voice recognition software.  Despite best efforts to proofread,  errors can occur which can change meaning.      Jeanmarie Plant, MD 07/06/16 1655    Jeanmarie Plant, MD 07/06/16 1720    Jeanmarie Plant, MD 07/06/16 [phone]

## 2016-07-06 NOTE — H&P (Signed)
[employer] - [place] [facility]   PATIENT NAME: Jodi Best    MR#:  [id number]  DATE OF BIRTH:  10-02-1928  DATE OF ADMISSION:  07/06/2016  PRIMARY CARE PHYSICIAN: [facility]   REQUESTING/REFERRING PHYSICIAN: McShane  CHIEF COMPLAINT:   Chief Complaint  Patient presents with  . Near Syncope    HISTORY OF PRESENT ILLNESS: Jodi Best  is a 83 y.o. female with a known history of Diabetes, hyperlipidemia, hypertension, osteoarthritis, recent admission for pneumonia and bacteremia, sent to rehabilitation with IV antibiotic which she finished the course and came home 2 weeks ago. At her baseline she is now walking with a walker, completely alert and oriented, lives with her daughter in an apartment. Today while she was on the bathroom she passed out and daughter was there so she brought to the emergency room. On further questioning she denies any excessive cough, shortness of breath, chest pain, palpitation, urinary symptoms. In ER she was found to have low potassium and sodium level and low magnesium level, also noted to have UTI.  PAST MEDICAL HISTORY:   Past Medical History:  Diagnosis Date  . Addison disease (HCC)   . Cataract   . Diabetes mellitus without complication (HCC)   . Hyperlipemia   . Hypertension   . Osteoarthritis     PAST SURGICAL HISTORY: Past Surgical History:  Procedure Laterality Date  . CORNEAL TRANSPLANT    . EYE SURGERY    . LUMBAR LAMINECTOMY    . WRIST SURGERY      SOCIAL HISTORY:  Social History  Substance Use Topics  . Smoking status: Never Smoker  . Smokeless tobacco: Never Used  . Alcohol use No    FAMILY HISTORY:  Family History  Problem Relation Age of Onset  . Hypertension Mother     DRUG ALLERGIES:  Allergies  Allergen Reactions  . Codeine     unlnown  . Penicillins     Has patient had a PCN reaction causing immediate rash, facial/tongue/throat swelling, SOB or lightheadedness with  hypotension: no Has patient had a PCN reaction causing severe rash involving mucus membranes or skin necrosis: no Has patient had a PCN reaction that required hospitalization no Has patient had a PCN reaction occurring within the last 10 years: yes If all of the above answers are "NO", then may proceed with Cephalosporin use.     REVIEW OF SYSTEMS:   CONSTITUTIONAL: No fever, fatigue or weakness.  EYES: No blurred or double vision.  EARS, NOSE, AND THROAT: No tinnitus or ear pain.  RESPIRATORY: No cough, shortness of breath, wheezing or hemoptysis.  CARDIOVASCULAR: No chest pain, orthopnea, edema.  GASTROINTESTINAL: No nausea, vomiting, diarrhea or abdominal pain.  GENITOURINARY: No dysuria, hematuria.  ENDOCRINE: No polyuria, nocturia,  HEMATOLOGY: No anemia, easy bruising or bleeding SKIN: No rash or lesion. MUSCULOSKELETAL: No joint pain or arthritis.   NEUROLOGIC: No tingling, numbness, weakness.  PSYCHIATRY: No anxiety or depression.   MEDICATIONS AT HOME:  Prior to Admission medications   Medication Sig Start Date End Date Taking? Authorizing Provider  amLODipine (NORVASC) 10 MG tablet Take 10 mg by mouth daily. 05/12/16  Yes Historical Provider, MD  aspirin EC 81 MG tablet Take 81 mg by mouth daily.   Yes Historical Provider, MD  calcium carbonate (OSCAL) 1500 (600 Ca) MG TABS tablet Take 1,500 mg by mouth 2 (two) times daily with a meal.   Yes Historical Provider, MD  Cholecalciferol 1000 UNITS capsule Take  1,000 Units by mouth daily.   Yes Historical Provider, MD  citalopram (CELEXA) 10 MG tablet Take 1 tablet (10 mg total) by mouth daily. 05/22/16  Yes Enid Baas, MD  dextromethorphan-guaiFENesin [facility] DM) 30-600 MG 12hr tablet Take 1 tablet by mouth 2 (two) times daily.   Yes Historical Provider, MD  fluticasone (FLONASE) 50 MCG/ACT nasal spray Place 2 sprays into both nostrils at bedtime.    Yes Historical Provider, MD  furosemide (LASIX) 40 MG tablet Take 40 mg  by mouth every morning. 04/11/16  Yes Historical Provider, MD  hydrochlorothiazide (HYDRODIURIL) 25 MG tablet Take 25 mg by mouth daily.   Yes Historical Provider, MD  hydrocortisone (CORTEF) 20 MG tablet Take 20 mg by mouth daily.   Yes Historical Provider, MD  lovastatin (MEVACOR) 40 MG tablet Take 40 mg by mouth at bedtime.   Yes Historical Provider, MD  metFORMIN (GLUCOPHAGE) 500 MG tablet Take by mouth 2 (two) times daily with a meal.   Yes Historical Provider, MD  metoprolol (LOPRESSOR) 50 MG tablet Take 1 tablet (50 mg total) by mouth 2 (two) times daily. 05/22/16  Yes Enid Baas, MD  senna-docusate (SENOKOT-S) 8.6-50 MG tablet Take 2 tablets by mouth daily. 05/22/16  Yes Enid Baas, MD  travoprost, benzalkonium, (TRAVATAN) 0.004 % ophthalmic solution 1 drop at bedtime.   Yes Historical Provider, MD  ALPRAZolam (XANAX) 0.25 MG tablet Take 1 tablet (0.25 mg total) by mouth 3 (three) times daily as needed for anxiety. Patient not taking: Reported on 07/06/2016 05/22/16   Enid Baas, MD  ceFAZolin (ANCEF) 2-4 GM/100ML-% IVPB Inject 100 mLs (2 g total) into the vein every 8 (eight) hours. Until 06/01/16 Patient not taking: Reported on 07/06/2016 05/22/16   Enid Baas, MD  feeding supplement, ENSURE ENLIVE, (ENSURE ENLIVE) LIQD Take 237 mLs by mouth 2 (two) times daily between meals. 05/22/16   Enid Baas, MD  polyethylene glycol (MIRALAX / GLYCOLAX) packet Take 17 g by mouth daily as needed for mild constipation. Patient not taking: Reported on 07/06/2016 05/22/16   Enid Baas, MD      PHYSICAL EXAMINATION:   VITAL SIGNS: Blood pressure (!) 103/55, pulse 83, temperature 97.6 F (36.4 C), temperature source Oral, resp. rate 19, height 5\' 3"  (1.6 m), weight 75.8 kg (167 lb), SpO2 97 %.  GENERAL:  83 y.o.-year-old patient lying in the bed with no acute distress.  EYES: Pupils equal, round, reactive to light and accommodation. No scleral icterus. Extraocular muscles  intact.  HEENT: Head atraumatic, normocephalic. Oropharynx and nasopharynx clear.  NECK:  Supple, no jugular venous distention. No thyroid enlargement, no tenderness.  LUNGS: Normal breath sounds bilaterally, no wheezing, rales,rhonchi or crepitation. No use of accessory muscles of respiration.  CARDIOVASCULAR: S1, S2 normal. No murmurs, rubs, or gallops.  ABDOMEN: Soft, nontender, nondistended. Bowel sounds present. No organomegaly or mass.  EXTREMITIES: No pedal edema, cyanosis, or clubbing.  NEUROLOGIC: Cranial nerves II through XII are intact. Muscle strength 4/5 in all extremities. Sensation intact. Gait not checked.  PSYCHIATRIC: The patient is alert and oriented x 3.  SKIN: No obvious rash, lesion, or ulcer.   LABORATORY PANEL:   CBC  Recent Labs Lab 07/06/16 1525  WBC 13.6*  HGB 10.5*  HCT 33.1*  PLT 410  MCV 85.7  MCH 27.3  MCHC 31.8*  RDW 17.6*   ------------------------------------------------------------------------------------------------------------------  Chemistries   Recent Labs Lab 07/06/16 1525  NA 128*  K 3.1*  CL 98*  CO2 18*  GLUCOSE 92  BUN 14  CREATININE 1.33*  CALCIUM 7.8*  MG 1.1*   ------------------------------------------------------------------------------------------------------------------ estimated creatinine clearance is 30.2 mL/min (by C-G formula based on SCr of 1.33 mg/dL (H)). ------------------------------------------------------------------------------------------------------------------ No results for input(s): TSH, T4TOTAL, T3FREE, THYROIDAB in the last 72 hours.  Invalid input(s): FREET3   Coagulation profile No results for input(s): INR, PROTIME in the last 168 hours. ------------------------------------------------------------------------------------------------------------------- No results for input(s): DDIMER in the last 72  hours. -------------------------------------------------------------------------------------------------------------------  Cardiac Enzymes No results for input(s): CKMB, TROPONINI, MYOGLOBIN in the last 168 hours.  Invalid input(s): CK ------------------------------------------------------------------------------------------------------------------ Invalid input(s): POCBNP  ---------------------------------------------------------------------------------------------------------------  Urinalysis    Component Value Date/Time   COLORURINE YELLOW (A) 07/06/2016 1525   APPEARANCEUR CLOUDY (A) 07/06/2016 1525   LABSPEC 1.013 07/06/2016 1525   PHURINE 5.0 07/06/2016 1525   GLUCOSEU NEGATIVE 07/06/2016 1525   HGBUR NEGATIVE 07/06/2016 1525   BILIRUBINUR NEGATIVE 07/06/2016 1525   KETONESUR NEGATIVE 07/06/2016 1525   PROTEINUR 30 (A) 07/06/2016 1525   NITRITE NEGATIVE 07/06/2016 1525   LEUKOCYTESUR MODERATE (A) 07/06/2016 1525     RADIOLOGY: Dg Chest 2 View  Result Date: 07/06/2016 CLINICAL DATA:  Acute onset of syncope.  Initial encounter. EXAM: CHEST  2 VIEW COMPARISON:  Chest radiograph from 05/16/2016 FINDINGS: The lungs are well-aerated. There is elevation of the right hemidiaphragm. Peribronchial thickening is noted. Patchy left basilar airspace opacity raises concern for mild pneumonia, depending on the patient's symptoms. There is no evidence of pleural effusion or pneumothorax. The heart is normal in size; the mediastinal contour is within normal limits. No acute osseous abnormalities are seen. IMPRESSION: Elevation of the right hemidiaphragm. Peribronchial thickening seen. Patchy left basilar airspace opacity raises concern for mild pneumonia, depending on the patient's symptoms. Electronically Signed   By: Roanna Raider M.D.   On: 07/06/2016 16:42    EKG: Orders placed or performed during the hospital encounter of 07/06/16  . ED EKG  . ED EKG  . EKG 12-Lead  . EKG 12-Lead     IMPRESSION AND PLAN:  * Syncopal episode   Likely secondary to UTI and some dehydration.   Patient is on diuretics at home.   She is also noted to have hyponatremia and hypokalemia, will give IV fluids and hold diuretics.   Keep on cardiac monitoring.  * Hyponatremia, hypokalemia, hypomagnesemia   She has some prolonged QT,   We'll replace IV and check tomorrow morning.  * UTI   IV ceftriaxone, urine culture and blood cultures are sent.  * Recent pneumonia, finding of infiltrate on lungs on chest x-ray.   Likely this is residual finding on chest x-ray, patient doesn't have much symptoms or fever.  * Hypertension   Blood pressure is currently running soft, low normal side.   I will hold all antihypertensive medications and diuretics for now, get orthostatic vital signs.  * Diabetes   She was taking metformin in past, taken off because of complain of GI upset and diarrhea.   I'll keep on insulin sliding scale coverage.  All the records are reviewed and case discussed with ED provider. Management plans discussed with the patient, family and they are in agreement.  CODE STATUS:full code. Code Status History    Date Active Date Inactive Code Status Order ID Comments User Context   05/16/2016 12:09 PM 05/22/2016  4:22 PM Full Code [id number]  Auburn Bilberry, MD Inpatient     Patient daughter was present in the room.  TOTAL TIME TAKING CARE OF THIS PATIENT: 50 minutes.  Altamese DillingVACHHANI, Madalynne Gutmann M.D on 07/06/2016   Between 7am to 6pm - Pager - [phone]  After 6pm go to [email or web address] - password [email or web address]  [facility]  Office  [phone]  CC: [facility] physician; [facility]   Note: This dictation was prepared with Dragon dictation along with smaller phrase technology. Any transcriptional errors that result from this process are unintentional.

## 2016-07-06 NOTE — ED Notes (Signed)
Admitting dr at bedside.  

## 2016-07-06 NOTE — ED Triage Notes (Signed)
Pt presents to ED from home via EMS c/o syncopal episode in the bathroom.

## 2016-07-06 NOTE — Progress Notes (Signed)
Family Meeting Note  Advance Directive:no  Today a meeting took place with the Patient and daughter.  The following clinical team members were present during this meeting:MD  The following were discussed:Patient's diagnosis: UTI, hypokalemia, Hyponatremia, Patient's progosis: Unable to determine and Goals for treatment: Full Code  Additional follow-up to be provided: PMD, PT eval.  Time spent during discussion:20 minutes  Aislynn Cifelli, Heath GoldVAIBHAVKUMAR, MD

## 2016-07-07 ENCOUNTER — Inpatient Hospital Stay: Payer: Medicare Other

## 2016-07-07 DIAGNOSIS — R55 Syncope and collapse: Secondary | ICD-10-CM | POA: Diagnosis present

## 2016-07-07 DIAGNOSIS — Z7951 Long term (current) use of inhaled steroids: Secondary | ICD-10-CM | POA: Diagnosis not present

## 2016-07-07 DIAGNOSIS — E271 Primary adrenocortical insufficiency: Secondary | ICD-10-CM | POA: Diagnosis present

## 2016-07-07 DIAGNOSIS — Z6828 Body mass index (BMI) 28.0-28.9, adult: Secondary | ICD-10-CM | POA: Diagnosis not present

## 2016-07-07 DIAGNOSIS — F329 Major depressive disorder, single episode, unspecified: Secondary | ICD-10-CM | POA: Diagnosis present

## 2016-07-07 DIAGNOSIS — E861 Hypovolemia: Secondary | ICD-10-CM | POA: Diagnosis present

## 2016-07-07 DIAGNOSIS — E785 Hyperlipidemia, unspecified: Secondary | ICD-10-CM | POA: Diagnosis present

## 2016-07-07 DIAGNOSIS — N183 Chronic kidney disease, stage 3 (moderate): Secondary | ICD-10-CM | POA: Diagnosis present

## 2016-07-07 DIAGNOSIS — N3000 Acute cystitis without hematuria: Secondary | ICD-10-CM | POA: Diagnosis present

## 2016-07-07 DIAGNOSIS — Z87440 Personal history of urinary (tract) infections: Secondary | ICD-10-CM | POA: Diagnosis not present

## 2016-07-07 DIAGNOSIS — G9341 Metabolic encephalopathy: Secondary | ICD-10-CM | POA: Diagnosis not present

## 2016-07-07 DIAGNOSIS — Z7984 Long term (current) use of oral hypoglycemic drugs: Secondary | ICD-10-CM | POA: Diagnosis not present

## 2016-07-07 DIAGNOSIS — Z7952 Long term (current) use of systemic steroids: Secondary | ICD-10-CM | POA: Diagnosis not present

## 2016-07-07 DIAGNOSIS — E876 Hypokalemia: Secondary | ICD-10-CM | POA: Diagnosis present

## 2016-07-07 DIAGNOSIS — T502X5A Adverse effect of carbonic-anhydrase inhibitors, benzothiadiazides and other diuretics, initial encounter: Secondary | ICD-10-CM | POA: Diagnosis present

## 2016-07-07 DIAGNOSIS — E1122 Type 2 diabetes mellitus with diabetic chronic kidney disease: Secondary | ICD-10-CM | POA: Diagnosis present

## 2016-07-07 DIAGNOSIS — E871 Hypo-osmolality and hyponatremia: Secondary | ICD-10-CM | POA: Diagnosis present

## 2016-07-07 DIAGNOSIS — E860 Dehydration: Secondary | ICD-10-CM | POA: Diagnosis present

## 2016-07-07 DIAGNOSIS — E669 Obesity, unspecified: Secondary | ICD-10-CM | POA: Diagnosis present

## 2016-07-07 DIAGNOSIS — I129 Hypertensive chronic kidney disease with stage 1 through stage 4 chronic kidney disease, or unspecified chronic kidney disease: Secondary | ICD-10-CM | POA: Diagnosis present

## 2016-07-07 DIAGNOSIS — Z7982 Long term (current) use of aspirin: Secondary | ICD-10-CM | POA: Diagnosis not present

## 2016-07-07 LAB — BASIC METABOLIC PANEL
Anion gap: 10 (ref 5–15)
BUN: 13 mg/dL (ref 6–20)
CO2: 18 mmol/L — ABNORMAL LOW (ref 22–32)
CREATININE: 1.38 mg/dL — AB (ref 0.44–1.00)
Calcium: 7.6 mg/dL — ABNORMAL LOW (ref 8.9–10.3)
Chloride: 95 mmol/L — ABNORMAL LOW (ref 101–111)
GFR calc Af Amer: 39 mL/min — ABNORMAL LOW (ref >60)
GFR, EST NON AFRICAN AMERICAN: 34 mL/min — AB (ref >60)
GLUCOSE: 96 mg/dL (ref 65–99)
POTASSIUM: 4.2 mmol/L (ref 3.5–5.1)
Sodium: 123 mmol/L — ABNORMAL LOW (ref 135–145)

## 2016-07-07 LAB — TSH: TSH: 2.209 u[IU]/mL (ref 0.350–4.500)

## 2016-07-07 LAB — GLUCOSE, CAPILLARY
GLUCOSE-CAPILLARY: 90 mg/dL (ref 65–99)
GLUCOSE-CAPILLARY: 118 mg/dL — AB (ref 65–99)
Glucose-Capillary: 204 mg/dL — ABNORMAL HIGH (ref 65–99)
Glucose-Capillary: 269 mg/dL — ABNORMAL HIGH (ref 65–99)

## 2016-07-07 LAB — CBC
HEMATOCRIT: 28.7 % — AB (ref 35.0–47.0)
Hemoglobin: 9.4 g/dL — ABNORMAL LOW (ref 12.0–16.0)
MCH: 27.9 pg (ref 26.0–34.0)
MCHC: 32.8 g/dL (ref 32.0–36.0)
MCV: 85.2 fL (ref 80.0–100.0)
PLATELETS: 417 10*3/uL (ref 150–440)
RBC: 3.37 MIL/uL — ABNORMAL LOW (ref 3.80–5.20)
RDW: 17.2 % — AB (ref 11.5–14.5)
WBC: 14.3 10*3/uL — ABNORMAL HIGH (ref 3.6–11.0)

## 2016-07-07 LAB — MAGNESIUM: MAGNESIUM: 2.4 mg/dL (ref 1.7–2.4)

## 2016-07-07 LAB — SODIUM
SODIUM: 122 mmol/L — AB (ref 135–145)
Sodium: 122 mmol/L — ABNORMAL LOW (ref 135–145)
Sodium: 121 mmol/L — ABNORMAL LOW (ref 135–145)

## 2016-07-07 LAB — CORTISOL: Cortisol, Plasma: <0.4 ug/dL

## 2016-07-07 MED ORDER — SODIUM CHLORIDE 0.9 % IV SOLN
INTRAVENOUS | Status: DC
  Administered 2016-07-07 – 2016-07-08 (×2): via INTRAVENOUS

## 2016-07-07 MED ORDER — HYDROCORTISONE NA SUCCINATE PF 100 MG IJ SOLR
50.0000 mg | Freq: Three times a day (TID) | INTRAMUSCULAR | Status: AC
  Administered 2016-07-07 (×2): 50 mg via INTRAVENOUS
  Filled 2016-07-07 (×2): qty 1

## 2016-07-07 MED ORDER — CALCIUM CARBONATE ANTACID 500 MG PO CHEW
3.0000 | CHEWABLE_TABLET | Freq: Two times a day (BID) | ORAL | Status: DC
  Administered 2016-07-07 – 2016-07-10 (×6): 600 mg via ORAL
  Filled 2016-07-07 (×6): qty 3

## 2016-07-07 MED ORDER — FLUDROCORTISONE ACETATE 0.1 MG PO TABS
0.0500 mg | ORAL_TABLET | Freq: Every day | ORAL | Status: DC
  Administered 2016-07-07 – 2016-07-08 (×2): 0.05 mg via ORAL
  Filled 2016-07-07: qty 1
  Filled 2016-07-07 (×2): qty 0.5

## 2016-07-07 NOTE — Care Management Important Message (Signed)
Important Message  Patient Details  Name: Jodi Best MRN: [id number] Date of Birth: [birth year]   Medicare Important Message Given:  Yes    Marily MemosLisa M Yemariam Magar, RN 07/07/2016, 11:00 AM

## 2016-07-07 NOTE — Progress Notes (Signed)
Subjective:   Patient known to our practice from outpatient.  She is followed by Dr. Cherylann Ratel. Last outpatient labs from December 27 show creatinine 1.39, GFR 35 Sodium was 139 Patient is not able to provide meaningful information.  Information is obtained from her daughter.  She states that patient returned from rehabilitation about 2 weeks ago.  Last Wednesday, she had some difficulty walking.  This past weekend, she had nausea, vomiting.  She was very weak. Passed out in the bathroom. Oral intake has been poor. Today, patient has been very somnolent and is not waking up and not following commands. admission creatinine was 1.33, today's creatinine is 1.38/34 Serum sodium at the time of admission was 128.  It has worsened to 123 today Repeat check show sodium of 122 Plasma cortisol level is less than 0.4   Objective:  Vital signs in last 24 hours:  Temp:  [98.7 F (37.1 C)-99.3 F (37.4 C)] 99 F (37.2 C) (03/05 1523) Pulse Rate:  [78-110] 85 (03/05 1523) Resp:  [18-19] 18 (03/05 1523) BP: (110-147)/(43-103) 110/55 (03/05 1523) SpO2:  [87 %-98 %] 95 % (03/05 1523) Weight:  [72.8 kg (160 lb 6.4 oz)] 72.8 kg (160 lb 6.4 oz) (03/04 1957)  Weight change:  Filed Weights   07/06/16 1504 07/06/16 1957  Weight: 75.8 kg (167 lb) 72.8 kg (160 lb 6.4 oz)    Intake/Output:    Intake/Output Summary (Last 24 hours) at 07/07/16 1706 Last data filed at 07/07/16 9604  Gross per 24 hour  Intake           871.25 ml  Output                0 ml  Net           871.25 ml     Physical Exam: General: Elderly lady, laying in the bed  HEENT Moist oral mucous membranes  Neck supple  Pulm/lungs Normal breathing effort, clear to auscultation  CVS/Heart irregular rhythm  Abdomen:  Soft, nontender  Extremities: No peripheral edema  Neurologic: Somnolent.  Not following commands  Skin: Normal turgor          Basic Metabolic Panel:   Recent Labs Lab 07/06/16 1525 07/07/16 0351  07/07/16 0755 07/07/16 1311  NA 128* 123* 122* 122*  K 3.1* 4.2  --   --   CL 98* 95*  --   --   CO2 18* 18*  --   --   GLUCOSE 92 96  --   --   BUN 14 13  --   --   CREATININE 1.33* 1.38*  --   --   CALCIUM 7.8* 7.6*  --   --   MG 1.1* 2.4  --   --      CBC:  Recent Labs Lab 07/06/16 1525 07/07/16 0351  WBC 13.6* 14.3*  HGB 10.5* 9.4*  HCT 33.1* 28.7*  MCV 85.7 85.2  PLT 410 417      Microbiology:  Recent Results (from the past 720 hour(s))  Culture, blood (routine x 2)     Status: None (Preliminary result)   Collection Time: 07/06/16  5:47 PM  Result Value Ref Range Status   Specimen Description BLOOD LEFT ANTECUBITAL  Final   Special Requests   Final    BOTTLES DRAWN AEROBIC AND ANAEROBIC AER 7CC, ANA 9CC   Culture NO GROWTH < 24 HOURS  Final   Report Status PENDING  Incomplete  Culture, blood (routine x 2)  Status: None (Preliminary result)   Collection Time: 07/06/16  5:47 PM  Result Value Ref Range Status   Specimen Description BLOOD RESISTANT HAND  Final   Special Requests BOTTLES DRAWN AEROBIC AND ANAEROBIC BCLV  Final   Culture NO GROWTH < 24 HOURS  Final   Report Status PENDING  Incomplete    Coagulation Studies: No results for input(s): LABPROT, INR in the last 72 hours.  Urinalysis:  Recent Labs  07/06/16 1525  COLORURINE YELLOW*  LABSPEC 1.013  PHURINE 5.0  GLUCOSEU NEGATIVE  HGBUR NEGATIVE  BILIRUBINUR NEGATIVE  KETONESUR NEGATIVE  PROTEINUR 30*  NITRITE NEGATIVE  LEUKOCYTESUR MODERATE*      Imaging: Dg Chest 2 View  Result Date: 07/06/2016 CLINICAL DATA:  Acute onset of syncope.  Initial encounter. EXAM: CHEST  2 VIEW COMPARISON:  Chest radiograph from 05/16/2016 FINDINGS: The lungs are well-aerated. There is elevation of the right hemidiaphragm. Peribronchial thickening is noted. Patchy left basilar airspace opacity raises concern for mild pneumonia, depending on the patient's symptoms. There is no evidence of pleural  effusion or pneumothorax. The heart is normal in size; the mediastinal contour is within normal limits. No acute osseous abnormalities are seen. IMPRESSION: Elevation of the right hemidiaphragm. Peribronchial thickening seen. Patchy left basilar airspace opacity raises concern for mild pneumonia, depending on the patient's symptoms. Electronically Signed   By: Roanna RaiderJeffery  Chang M.D.   On: 07/06/2016 16:42   Ct Head Wo Contrast  Result Date: 07/07/2016 CLINICAL DATA:  Confusion EXAM: CT HEAD WITHOUT CONTRAST TECHNIQUE: Contiguous axial images were obtained from the base of the skull through the vertex without intravenous contrast. COMPARISON:  None. FINDINGS: Brain: Moderate atrophy. Hypodensity in the cerebral white matter bilaterally compatible with chronic microvascular ischemia. Negative for acute infarct. Negative for hemorrhage or mass. No shift of the midline structures. Vascular: No hyperdense vessel or unexpected calcification. Skull: Negative Sinuses/Orbits: Negative Other: None IMPRESSION: Atrophy and chronic microvascular ischemic change. No acute abnormality. Electronically Signed   By: Marlan Palauharles  Clark M.D.   On: 07/07/2016 10:38     Medications:   . sodium chloride 100 mL/hr at 07/07/16 0852   . aspirin EC  81 mg Oral Daily  . calcium carbonate  3 tablet Oral BID  . cefTRIAXone (ROCEPHIN) IVPB 1 gram/50 mL D5W  1 g Intravenous Q24H  . cholecalciferol  1,000 Units Oral Daily  . citalopram  10 mg Oral Daily  . feeding supplement (ENSURE ENLIVE)  237 mL Oral BID BM  . fludrocortisone  0.05 mg Oral Q breakfast  . fluticasone  2 spray Each Nare QHS  . heparin  5,000 Units Subcutaneous Q8H  . insulin aspart  0-9 Units Subcutaneous TID WC  . pravastatin  40 mg Oral q1800  . senna-docusate  2 tablet Oral Daily  . sodium chloride flush  3 mL Intravenous Q12H  . travoprost (benzalkonium)  1 drop Both Eyes QHS   ALPRAZolam, guaiFENesin **AND** dextromethorphan, docusate sodium, polyethylene  glycol  Assessment/ Plan:  83 y.o. female with Addison's disease, diabetes type 2, hypertension, recurrent UTIs, hyperlipidemia, osteoarthritis, obesity, history of laminectomy, chronic kidney disease stage III  1. Hyponatremia 2.  Chronic kidney disease stage III with baseline creatinine 1. 3/GFR 35 3.  Addison's disease.  Pulmicort is less than 0.4 4.  Urinary tract infection  plan: Patient's chronic kidney disease stable Creatinine is at baseline The cause for acute hyponatremia is not entirely clear. Patient was taking furosemide at home along with hydrochlorothiazide which may have  contributed to hyponatremia.  However, her sodium levels have not improved with IV normal saline.  She has now been restarted on Florinef for Addison's disease.  We will assess response to this medication. She is currently getting IV antibiotics for urinary tract infection.  We will follow along with you.    LOS: 0 Makyna Niehoff 3/5/20185:06 PM

## 2016-07-07 NOTE — Progress Notes (Addendum)
[facility] [place] [facility]   PATIENT NAME: Jodi Best    MR#:  [id number]  DATE OF BIRTH:  October 24, 1928  SUBJECTIVE:  Patient confused this am Daughter at bedside.  REVIEW OF SYSTEMS:    ROS  Confused Tolerating Diet: yes      DRUG ALLERGIES:   Allergies  Allergen Reactions  . Codeine     unlnown  . Penicillins     Has patient had a PCN reaction causing immediate rash, facial/tongue/throat swelling, SOB or lightheadedness with hypotension: no Has patient had a PCN reaction causing severe rash involving mucus membranes or skin necrosis: no Has patient had a PCN reaction that required hospitalization no Has patient had a PCN reaction occurring within the last 10 years: yes If all of the above answers are "NO", then may proceed with Cephalosporin use.     VITALS:  Blood pressure (!) 124/46, pulse 100, temperature 99.2 F (37.3 C), temperature source Oral, resp. rate 19, height 5\' 3"  (1.6 m), weight 72.8 kg (160 lb 6.4 oz), SpO2 92 %.  PHYSICAL EXAMINATION:   Physical Exam  Constitutional: She is well-developed, well-nourished, and in no distress. No distress.  HENT:  Head: Normocephalic.  Eyes: No scleral icterus.  Neck: Normal range of motion. Neck supple. No JVD present. No tracheal deviation present.  Cardiovascular: Normal rate, regular rhythm and normal heart sounds.  Exam reveals no gallop and no friction rub.   No murmur heard. Pulmonary/Chest: Effort normal and breath sounds normal. No respiratory distress. She has no wheezes. She has no rales. She exhibits no tenderness.  Abdominal: Soft. Bowel sounds are normal. She exhibits no distension and no mass. There is no tenderness. There is no rebound and no guarding.  Musculoskeletal: Normal range of motion. She exhibits no edema.  Neurological: She is alert.  confused  Skin: Skin is warm. No rash noted. No erythema.      LABORATORY PANEL:   CBC  Recent Labs Lab  07/07/16 0351  WBC 14.3*  HGB 9.4*  HCT 28.7*  PLT 417   ------------------------------------------------------------------------------------------------------------------  Chemistries   Recent Labs Lab 07/07/16 0351 07/07/16 0755  NA 123* 122*  K 4.2  --   CL 95*  --   CO2 18*  --   GLUCOSE 96  --   BUN 13  --   CREATININE 1.38*  --   CALCIUM 7.6*  --   MG 2.4  --    ------------------------------------------------------------------------------------------------------------------  Cardiac Enzymes No results for input(s): TROPONINI in the last 168 hours. ------------------------------------------------------------------------------------------------------------------  RADIOLOGY:  Dg Chest 2 View  Result Date: 07/06/2016 CLINICAL DATA:  Acute onset of syncope.  Initial encounter. EXAM: CHEST  2 VIEW COMPARISON:  Chest radiograph from 05/16/2016 FINDINGS: The lungs are well-aerated. There is elevation of the right hemidiaphragm. Peribronchial thickening is noted. Patchy left basilar airspace opacity raises concern for mild pneumonia, depending on the patient's symptoms. There is no evidence of pleural effusion or pneumothorax. The heart is normal in size; the mediastinal contour is within normal limits. No acute osseous abnormalities are seen. IMPRESSION: Elevation of the right hemidiaphragm. Peribronchial thickening seen. Patchy left basilar airspace opacity raises concern for mild pneumonia, depending on the patient's symptoms. Electronically Signed   By: Roanna Raider M.D.   On: 07/06/2016 16:42     ASSESSMENT AND PLAN:   83 year old female with history of Addison's disease and diabetes who presented with syncopal event and now found to have severe hyponatremia.  1.  Severe hyponatremia history of Addison's disease: I spoke with Dr. Tedd SiasSoLum this am. Plan to increase to stress dose steroids and start Florinef. Patient will be on 50 mg by mouth twice a day of Solu-Cortef for 2  days and then 20 mg in the morning and 10 mg in the evening. Sodium levels every 6 hours Continue IV fluids Nephrology consultation also obtained  2. Syncope due to vasovagal episode from straining while having bowel movement  3. Hypokalemia: Resolved with replacement  4. Urinary tract infection: Continue Rocephin and follow up on urine culture  5. Acute metabolic encephalopathy in the setting of UTI and hyponatremia Order head CT with history of recent fall  6. Diabetes: Sliding scale insulin  7. Depression: Continue Celexa  8. Essential hypertension: Blood pressure medications on hold for now due to low normal blood pressure     D/w Dr Tedd SiasSolum    Management plans discussed with the patient's daugheter and she is in agreement.  CODE STATUS: FULL  TOTAL TIME TAKING CARE OF THIS PATIENT: 34 minutes.     POSSIBLE D/C 2-3 days, DEPENDING ON CLINICAL CONDITION.   Orvell Careaga M.D on 07/07/2016 at 8:52 AM  Between 7am to 6pm - Pager - [phone] After 6pm go to [email or web address] - password [email or web address]  [facility]  Office  [phone]  CC: Primary care physician; [facility]  Note: This dictation was prepared with Dragon dictation along with smaller phrase technology. Any transcriptional errors that result from this process are unintentional.

## 2016-07-07 NOTE — Evaluation (Signed)
Physical Therapy Evaluation Patient Details Name: Jodi Best MRN: [id number] DOB: [birth year] Today's Date: 07/07/2016   History of Present Illness  Jodi Best  is a 83 y.o. female with a known history of Diabetes, hyperlipidemia, hypertension, osteoarthritis, recent admission for pneumonia and bacteremia, sent to rehabilitation with IV antibiotic which she finished the course and came home 2 weeks ago. At her baseline she is now walking with a walker, completely alert and oriented, lives with her daughter in an apartment. Yesterday while she was on the bathroom, she passed out and daughter was there so she brought to the emergency room. On further questioning, she denies any excessive cough, shortness of breath, chest pain, palpitation, urinary symptoms. In ER she was found to have low potassium and sodium level and low magnesium level, also noted to have UTI. Pt is very confused this AM and is AOx1. Unable to provide history. Attempted to contact daughter via telephone but phone service states number was disconnected. History obtained via medical record.   Clinical Impression  Pt admitted with above diagnosis. Pt currently with functional limitations due to the deficits listed below (see PT Problem List). Pt is currently very confused. She is AOx1 and unable to provide history. Attempted to contact daughter via telephone but phone service states number was disconnected. History obtained via medical record. Upon arrival pt on room air and SaO2 at 87%. Per RN pt placed on 2L/min O2 and maintained throughout session. SaO2 improves on supplemental O2 to 94%. Pt requires modA+2 for bed mobility and transfers. She struggles with balance in sitting at EOB and continually falls to the right. She also reports wanting to lay back down. Pt able to come to standing with considerable assistance and refuses to remain upright long enough to complete orthostatic vitals. Orthostatic vitals negative from supine to  sitting. While in standing pt demonstrates poor balance and LE weakness. Unable/unsafe to attempt ambulation at this time. Pt will need SNF placement at discharge. Will continue to follow and update DC plans as necessary as mental status improves. Pt will benefit from skilled PT services to address deficits in strength, balance, and mobility in order to return to full function at home.     Follow Up Recommendations SNF    Equipment Recommendations  None recommended by PT    Recommendations for Other Services       Precautions / Restrictions Precautions Precautions: Fall Restrictions Weight Bearing Restrictions: No      Mobility  Bed Mobility Overal bed mobility: Needs Assistance Bed Mobility: Supine to Sit;Sit to Supine     Supine to sit: Mod assist Sit to supine: Mod assist;+2 for physical assistance   General bed mobility comments: Pt is weak and confused. Requires considerable assist from therapist as well as heavy cues to participate. Able to sit upright at EOB but requires modA+1 to remain in sitting as she continues to fall toward the R side and states she wants to lay back down  Transfers Overall transfer level: Needs assistance Equipment used: Rolling walker (2 wheeled) Transfers: Sit to/from Stand Sit to Stand: Mod assist;+2 physical assistance         General transfer comment: Pt requires modA+2 for sit to stand. Heavy encouragement and cues provided. Once upright pt continually attempts to sit back down stating she doesn't want to stand. Unable/unsafe to attempt ambulation. Unable to obtain standing vitals to complete orthostatic measurements  Ambulation/Gait  Stairs            Wheelchair Mobility    Modified Rankin (Stroke Patients Only)       Balance Overall balance assessment: Needs assistance Sitting-balance support: Bilateral upper extremity supported;Feet supported Sitting balance-Leahy Scale: Poor Sitting balance -  Comments: Continually falls to the R and attempts to lay back down when sitting at EOB. Becomes tearful that therapist makes pt remain upright in sitting to perform orthstatic vitals   Standing balance support: Bilateral upper extremity supported Standing balance-Leahy Scale: Poor                               Pertinent Vitals/Pain Pain Assessment: No/denies pain    Home Living Family/patient expects to be discharged to:: Private residence Living Arrangements: Children Available Help at Discharge: Family Type of Home: House Home Access: Level entry     Home Layout: One level Home Equipment: Cane - single point;Walker - 2 wheels      Prior Function Level of Independence: Needs assistance         Comments: Chart reports that pt ambulates with rolling walker. Unclear if she is currently requiring assistance for ADLs/IADLs     Hand Dominance   Dominant Hand: Right    Extremity/Trunk Assessment   Upper Extremity Assessment Upper Extremity Assessment: Generalized weakness;Difficult to assess due to impaired cognition    Lower Extremity Assessment Lower Extremity Assessment: Generalized weakness;Difficult to assess due to impaired cognition       Communication   Communication: No difficulties  Cognition Arousal/Alertness: Lethargic Behavior During Therapy: Anxious;Restless (Tearful) Overall Cognitive Status: No family/caregiver present to determine baseline cognitive functioning                 General Comments: Per RN family reports she has some mild worsening dementia but her current state is a significant deviation from baseline. Per MR pt currently with AMS secondary to metabolic encephalopathy from UTI and altered electrolytes    General Comments      Exercises     Assessment/Plan    PT Assessment Patient needs continued PT services  PT Problem List Decreased strength;Decreased activity tolerance;Decreased balance;Decreased  cognition;Decreased mobility;Decreased knowledge of use of DME;Decreased safety awareness       PT Treatment Interventions DME instruction;Gait training;Functional mobility training;Balance training;Therapeutic exercise;Therapeutic activities;Neuromuscular re-education;Cognitive remediation;Patient/family education    PT Goals (Current goals can be found in the Care Plan section)  Acute Rehab PT Goals PT Goal Formulation: Patient unable to participate in goal setting    Frequency Min 2X/week   Barriers to discharge Other (comment) (confusion) Pt currently confused and mobility is significantly impaired    Co-evaluation               End of Session Equipment Utilized During Treatment: Gait belt Activity Tolerance: Treatment limited secondary to agitation Patient left: in bed;with call bell/phone within reach;with bed alarm set Nurse Communication: Other (comment) (Low SaO2 readings, confusion) PT Visit Diagnosis: Unsteadiness on feet (R26.81);Muscle weakness (generalized) (M62.81)    Functional Assessment Tool Used: AM-PAC 6 Clicks Basic Mobility;Clinical judgement Functional Limitation: Mobility: Walking and moving around Mobility: Walking and Moving Around Current Status ([id number]([id number]): At least 60 percent but less than 80 percent impaired, limited or restricted Mobility: Walking and Moving Around Goal Status [phone]([id number]): At least 20 percent but less than 40 percent impaired, limited or restricted    Time: 0915-0936 PT Time Calculation (min) (ACUTE ONLY): 21  min   Charges:   PT Evaluation $PT Eval Moderate Complexity: 1 Procedure     PT G Codes:   PT G-Codes **NOT FOR INPATIENT CLASS** Functional Assessment Tool Used: AM-PAC 6 Clicks Basic Mobility;Clinical judgement Functional Limitation: Mobility: Walking and moving around Mobility: Walking and Moving Around Current Status (W0981): At least 60 percent but less than 80 percent impaired, limited or restricted Mobility: Walking  and Moving Around Goal Status [phone]): At least 20 percent but less than 40 percent impaired, limited or restricted    Lynnea Maizes PT, DPT   Huprich,Jason 07/07/2016, 10:15 AM

## 2016-07-08 LAB — BASIC METABOLIC PANEL
ANION GAP: 10 (ref 5–15)
BUN: 16 mg/dL (ref 6–20)
CHLORIDE: 99 mmol/L — AB (ref 101–111)
CO2: 19 mmol/L — AB (ref 22–32)
Calcium: 7.9 mg/dL — ABNORMAL LOW (ref 8.9–10.3)
Creatinine, Ser: 1.14 mg/dL — ABNORMAL HIGH (ref 0.44–1.00)
GFR calc non Af Amer: 43 mL/min — ABNORMAL LOW (ref >60)
GFR, EST AFRICAN AMERICAN: 49 mL/min — AB (ref >60)
Glucose, Bld: 198 mg/dL — ABNORMAL HIGH (ref 65–99)
Potassium: 4.4 mmol/L (ref 3.5–5.1)
Sodium: 128 mmol/L — ABNORMAL LOW (ref 135–145)

## 2016-07-08 LAB — CBC
HEMATOCRIT: 31.1 % — AB (ref 35.0–47.0)
Hemoglobin: 10 g/dL — ABNORMAL LOW (ref 12.0–16.0)
MCH: 27.2 pg (ref 26.0–34.0)
MCHC: 32.2 g/dL (ref 32.0–36.0)
MCV: 84.3 fL (ref 80.0–100.0)
PLATELETS: 456 10*3/uL — AB (ref 150–440)
RBC: 3.69 MIL/uL — ABNORMAL LOW (ref 3.80–5.20)
RDW: 17.6 % — AB (ref 11.5–14.5)
WBC: 14.2 10*3/uL — AB (ref 3.6–11.0)

## 2016-07-08 LAB — GLUCOSE, CAPILLARY
GLUCOSE-CAPILLARY: 162 mg/dL — AB (ref 65–99)
GLUCOSE-CAPILLARY: 227 mg/dL — AB (ref 65–99)
GLUCOSE-CAPILLARY: 190 mg/dL — AB (ref 65–99)
Glucose-Capillary: 171 mg/dL — ABNORMAL HIGH (ref 65–99)

## 2016-07-08 LAB — SODIUM
SODIUM: 124 mmol/L — AB (ref 135–145)
SODIUM: 125 mmol/L — AB (ref 135–145)
SODIUM: 126 mmol/L — AB (ref 135–145)
Sodium: 127 mmol/L — ABNORMAL LOW (ref 135–145)

## 2016-07-08 LAB — URINE CULTURE: Culture: NO GROWTH

## 2016-07-08 MED ORDER — TRAZODONE HCL 50 MG PO TABS
50.0000 mg | ORAL_TABLET | Freq: Once | ORAL | Status: AC
  Administered 2016-07-08: 50 mg via ORAL
  Filled 2016-07-08: qty 1

## 2016-07-08 MED ORDER — GLUCERNA SHAKE PO LIQD
237.0000 mL | Freq: Two times a day (BID) | ORAL | Status: DC
  Administered 2016-07-08 – 2016-07-10 (×3): 237 mL via ORAL

## 2016-07-08 MED ORDER — FLUDROCORTISONE ACETATE 0.1 MG PO TABS
0.0500 mg | ORAL_TABLET | Freq: Two times a day (BID) | ORAL | Status: DC
  Administered 2016-07-08 – 2016-07-10 (×4): 0.05 mg via ORAL
  Filled 2016-07-08: qty 2
  Filled 2016-07-08: qty 1
  Filled 2016-07-08: qty 5
  Filled 2016-07-08: qty 1

## 2016-07-08 NOTE — Care Management (Signed)
Patient was active with [facility] prior to admission. [facility]. [facility] notified of plan for SNF.

## 2016-07-08 NOTE — Progress Notes (Signed)
Subjective:   Patient feels better today Na 128->127 -> 125 Appetite is poor  No N/V reported  Objective:  Vital signs in last 24 hours:  Temp:  [97.5 F (36.4 C)-98.9 F (37.2 C)] 98.9 F (37.2 C) (03/06 0504) Pulse Rate:  [71-90] 71 (03/06 0736) Resp:  [18-20] 18 (03/06 0736) BP: (114-115)/(50-58) 114/58 (03/06 0736) SpO2:  [92 %-96 %] 96 % (03/06 0736)  Weight change:  Filed Weights   07/06/16 1504 07/06/16 1957  Weight: 75.8 kg (167 lb) 72.8 kg (160 lb 6.4 oz)    Intake/Output:    Intake/Output Summary (Last 24 hours) at 07/08/16 1524 Last data filed at 07/08/16 1200  Gross per 24 hour  Intake          2817.08 ml  Output                0 ml  Net          2817.08 ml     Physical Exam: General: Elderly lady, laying in the bed  HEENT Moist oral mucous membranes  Neck supple  Pulm/lungs Normal breathing effort, clear to auscultation  CVS/Heart irregular rhythm  Abdomen:  Soft, nontender  Extremities: No peripheral edema  Neurologic: Alert, oriented to self and family, following commands  Skin: Normal turgor          Basic Metabolic Panel:   Recent Labs Lab 07/06/16 1525 07/07/16 0351  07/07/16 1900 07/08/16 0109 07/08/16 0343 07/08/16 0801 07/08/16 1409  NA 128* 123*  < > 121* 124* 128* 127* 125*  K 3.1* 4.2  --   --   --  4.4  --   --   CL 98* 95*  --   --   --  99*  --   --   CO2 18* 18*  --   --   --  19*  --   --   GLUCOSE 92 96  --   --   --  198*  --   --   BUN 14 13  --   --   --  16  --   --   CREATININE 1.33* 1.38*  --   --   --  1.14*  --   --   CALCIUM 7.8* 7.6*  --   --   --  7.9*  --   --   MG 1.1* 2.4  --   --   --   --   --   --   < > = values in this interval not displayed.   CBC:  Recent Labs Lab 07/06/16 1525 07/07/16 0351 07/08/16 0343  WBC 13.6* 14.3* 14.2*  HGB 10.5* 9.4* 10.0*  HCT 33.1* 28.7* 31.1*  MCV 85.7 85.2 84.3  PLT 410 417 456*      Microbiology:  Recent Results (from the past 720 hour(s))   Urine culture     Status: None   Collection Time: 07/06/16  4:56 PM  Result Value Ref Range Status   Specimen Description URINE, RANDOM  Final   Special Requests NONE  Final   Culture   Final    NO GROWTH Performed at [facility], [address] [place], [place] [address]    Report Status 07/08/2016 FINAL  Final  Culture, blood (routine x 2)     Status: None (Preliminary result)   Collection Time: 07/06/16  5:47 PM  Result Value Ref Range Status   Specimen Description BLOOD LEFT ANTECUBITAL  Final  Special Requests   Final    BOTTLES DRAWN AEROBIC AND ANAEROBIC AER 7CC, ANA 9CC   Culture NO GROWTH 2 DAYS  Final   Report Status PENDING  Incomplete  Culture, blood (routine x 2)     Status: None (Preliminary result)   Collection Time: 07/06/16  5:47 PM  Result Value Ref Range Status   Specimen Description BLOOD RESISTANT HAND  Final   Special Requests BOTTLES DRAWN AEROBIC AND ANAEROBIC BCLV  Final   Culture NO GROWTH 2 DAYS  Final   Report Status PENDING  Incomplete    Coagulation Studies: No results for input(s): LABPROT, INR in the last 72 hours.  Urinalysis:  Recent Labs  07/06/16 1525  COLORURINE YELLOW*  LABSPEC 1.013  PHURINE 5.0  GLUCOSEU NEGATIVE  HGBUR NEGATIVE  BILIRUBINUR NEGATIVE  KETONESUR NEGATIVE  PROTEINUR 30*  NITRITE NEGATIVE  LEUKOCYTESUR MODERATE*      Imaging: Dg Chest 2 View  Result Date: 07/06/2016 CLINICAL DATA:  Acute onset of syncope.  Initial encounter. EXAM: CHEST  2 VIEW COMPARISON:  Chest radiograph from 05/16/2016 FINDINGS: The lungs are well-aerated. There is elevation of the right hemidiaphragm. Peribronchial thickening is noted. Patchy left basilar airspace opacity raises concern for mild pneumonia, depending on the patient's symptoms. There is no evidence of pleural effusion or pneumothorax. The heart is normal in size; the mediastinal contour is within normal limits. No acute osseous abnormalities are seen. IMPRESSION:  Elevation of the right hemidiaphragm. Peribronchial thickening seen. Patchy left basilar airspace opacity raises concern for mild pneumonia, depending on the patient's symptoms. Electronically Signed   By: Roanna Raider M.D.   On: 07/06/2016 16:42   Ct Head Wo Contrast  Result Date: 07/07/2016 CLINICAL DATA:  Confusion EXAM: CT HEAD WITHOUT CONTRAST TECHNIQUE: Contiguous axial images were obtained from the base of the skull through the vertex without intravenous contrast. COMPARISON:  None. FINDINGS: Brain: Moderate atrophy. Hypodensity in the cerebral white matter bilaterally compatible with chronic microvascular ischemia. Negative for acute infarct. Negative for hemorrhage or mass. No shift of the midline structures. Vascular: No hyperdense vessel or unexpected calcification. Skull: Negative Sinuses/Orbits: Negative Other: None IMPRESSION: Atrophy and chronic microvascular ischemic change. No acute abnormality. Electronically Signed   By: Marlan Palau M.D.   On: 07/07/2016 10:38     Medications:    . aspirin EC  81 mg Oral Daily  . calcium carbonate  3 tablet Oral BID  . cholecalciferol  1,000 Units Oral Daily  . citalopram  10 mg Oral Daily  . feeding supplement (GLUCERNA SHAKE)  237 mL Oral BID BM  . fludrocortisone  0.05 mg Oral Q breakfast  . fluticasone  2 spray Each Nare QHS  . heparin  5,000 Units Subcutaneous Q8H  . insulin aspart  0-9 Units Subcutaneous TID WC  . pravastatin  40 mg Oral q1800  . senna-docusate  2 tablet Oral Daily  . sodium chloride flush  3 mL Intravenous Q12H  . travoprost (benzalkonium)  1 drop Both Eyes QHS   ALPRAZolam, guaiFENesin **AND** dextromethorphan, docusate sodium, polyethylene glycol  Assessment/ Plan:  83 y.o. female with Addison's disease, diabetes type 2, hypertension, recurrent UTIs, hyperlipidemia, osteoarthritis, obesity, history of laminectomy, chronic kidney disease stage III  1. Hyponatremia 2.  Chronic kidney disease stage III with  baseline creatinine 1. 3/GFR 35 3.  Addison's disease.  Cortisol level is less than 0.4 4.  Urinary tract infection  plan: Patient's chronic kidney disease stable Creatinine is at baseline The  cause for acute hyponatremia is not entirely clear. Patient was taking furosemide at home along with hydrochlorothiazide which may have contributed to hyponatremia.  However, her sodium levels have not improved with IV normal saline.  She has now been restarted on Florinef for Addison's disease.  We will assess response to this medication. She is currently getting IV antibiotics for urinary tract infection.  We will follow along with you. Increase florinef to BID D/c NS   LOS: 1 Lydie Stammen 3/6/20183:24 PM

## 2016-07-08 NOTE — Clinical Social Work Note (Signed)
Clinical Social Work Assessment  Patient Details  Name: Jodi BOHLIN MRN: [id number] Date of Birth: [birth year]  Date of referral:  07/08/16               Reason for consult:  Facility Placement                Permission sought to share information with:  Chartered certified accountant granted to share information::  Yes, Verbal Permission Granted  Name::      [place]::   [place]   Relationship::     Contact Information:     Housing/Transportation Living arrangements for the past 2 months:  [place], [place] of Information:  Patient, Adult Children, Power of Attorney Patient Interpreter Needed:  None Criminal Activity/Legal Involvement Pertinent to Current Situation/Hospitalization:  No - Comment as needed Significant Relationships:  Adult Children Lives with:  Adult Children Do you feel safe going back to the place where you live?  Yes Need for family participation in patient care:  Yes (Comment)  Care giving concerns:  Patient lives with her daughter Lawrence Santiago in an apartment in [place].      Social Worker assessment / plan:  Holiday representative (CSW) reviewed chart and noted that PT is recommending SNF. CSW met with patient alone at bedside to discuss D/C plan. Patient was pleasant however she fell asleep during assessment and did not answer questions. CSW contacted patient's daughter Lawrence Santiago to complete assessment. Per daughter she is patient's HPOA and they live together in an apartment in [place]. Per daughter patient was released from [facility] 2 weeks ago and was there for IV Abx. CSW explained that PT is recommending SNF again on this admission. Per daughter patient was at [facility] for roughly 28 days. CSW explained that patient has not had a 60 day wellness period and her medicare rehab days will not start over. Patient will return to rehab on day 29. Per daughter patient has a  Development worker, community that will pick up the co-pays for SNF. Daughter requested [facility]. FL2 complete and faxed out. CSW will continue to follow and assist as needed.   Employment status:  Retired Forensic scientist:  [employer] PT Recommendations:  [place] / Referral to community resources:  [place]  Patient/Family's Response to care:  Patient's daughter is agreeable for [employer] in [place].   Patient/Family's Understanding of and Emotional Response to Diagnosis, Current Treatment, and Prognosis:  Daughter was very pleasant and thanked CSW for assistance.   Emotional Assessment Appearance:  Appears stated age Attitude/Demeanor/Rapport:    Affect (typically observed):  Pleasant Orientation:  Oriented to Self, Oriented to Place, Fluctuating Orientation (Suspected and/or reported Sundowners) Alcohol / Substance use:  Not Applicable Psych involvement (Current and /or in the community):  No (Comment)  Discharge Needs  Concerns to be addressed:  Discharge Planning Concerns Readmission within the last 30 days:  No Current discharge risk:  Dependent with Mobility Barriers to Discharge:  Continued Medical Work up   [employer], [employer], LCSW 07/08/2016, 11:12 AM

## 2016-07-08 NOTE — Clinical Social Work Placement (Signed)
   CLINICAL SOCIAL WORK PLACEMENT  NOTE  Date:  07/08/2016  Patient Details  Name: Donivan Scullancy L Harari MRN: [id number] Date of Birth: 1928/07/25  Clinical Social Work is seeking post-discharge placement for this patient at the Skilled  Nursing Facility level of care (*CSW will initial, date and re-position this form in  chart as items are completed):  Yes   Patient/family provided with [facility] Clinical Social Work Department's list of facilities offering this level of care within the geographic area requested by the patient (or if unable, by the patient's family).  Yes   Patient/family informed of their freedom to choose among providers that offer the needed level of care, that participate in Medicare, Medicaid or managed care program needed by the patient, have an available bed and are willing to accept the patient.  Yes   Patient/family informed of [facility]'s ownership interest in [facility] and [facility], as well as of the fact that they are under no obligation to receive care at these facilities.  PASRR submitted to EDS on       PASRR number received on       Existing PASRR number confirmed on 07/08/16     FL2 transmitted to all facilities in geographic area requested by pt/family on 07/08/16     FL2 transmitted to all facilities within larger geographic area on       Patient informed that his/her managed care company has contracts with or will negotiate with certain facilities, including the following:            Patient/family informed of bed offers received.  Patient chooses bed at       Physician recommends and patient chooses bed at      Patient to be transferred to   on  .  Patient to be transferred to facility by       Patient family notified on   of transfer.  Name of family member notified:        PHYSICIAN       Additional Comment:    _______________________________________________ Dhwani Venkatesh, Darleen CrockerBailey M, LCSW 07/08/2016, 11:10 AM

## 2016-07-08 NOTE — Progress Notes (Signed)
Clinical Child psychotherapistocial Worker (CSW) presented bed offers to patient and her daughter Maren BeachRolanda and they chose [facility]. [facility] coordinator at [facility] is aware of accepted bed offer. CSW will continue to follow and assist as needed.   [employer] Jimie Kuwahara, LCSW [phone]

## 2016-07-08 NOTE — [place] FL2 (Signed)
[place] MEDICAID FL2 LEVEL OF CARE SCREENING TOOL     IDENTIFICATION  Patient Name: Jodi Best Birthdate: [birth year] Sex: female Admission Date (Current Location): 07/06/2016  [place]   Facility and Address:  [facility], [address], [place], [place] [address]      Provider Number: [id number]  Attending Physician Name and Address:  Adrian SaranSital Mody, MD  Relative Name and Phone Number:       Current Level of Care: Hospital Recommended Level of Care: Skilled Nursing Facility Prior Approval Number:    Date Approved/Denied:   PASRR Number:  [id number])  Discharge Plan: SNF    Current Diagnoses: Patient Active Problem List   Diagnosis Date Noted  . Hyponatremia 07/07/2016  . Syncope 07/06/2016  . UTI (urinary tract infection) 07/06/2016  . Syncopal episodes 07/06/2016  . Adjustment disorder with mixed anxiety and depressed mood 05/21/2016  . Pressure injury of skin 05/17/2016  . PNA (pneumonia) 05/16/2016    Orientation RESPIRATION BLADDER Height & Weight     Self, Place  O2 (2 Liters Oxygen ) Incontinent Weight: 160 lb 6.4 oz (72.8 kg) Height:  5\' 3"  (160 cm)  BEHAVIORAL SYMPTOMS/MOOD NEUROLOGICAL BOWEL NUTRITION STATUS   (none)  (none) Incontinent Diet (Diet: Heart Healthy/ Carb Modified )  AMBULATORY STATUS COMMUNICATION OF NEEDS Skin   Extensive Assist Verbally Normal                       Personal Care Assistance Level of Assistance  Bathing, Feeding, Dressing Bathing Assistance: Limited assistance Feeding assistance: Independent Dressing Assistance: Limited assistance     Functional Limitations Info  Sight, Hearing, Speech Sight Info: Adequate Hearing Info: Adequate Speech Info: Adequate    SPECIAL CARE FACTORS FREQUENCY  PT (By licensed PT), OT (By licensed OT)     PT Frequency:  (5) OT Frequency:  (5)            Contractures      Additional Factors Info  Code Status,  Allergies Code Status Info:  (Full Code. ) Allergies Info:  (Codeine, Penicillins)           Current Medications (07/08/2016):  This is the current hospital active medication list Current Facility-Administered Medications  Medication Dose Route Frequency Provider Last Rate Last Dose  . ALPRAZolam Prudy Feeler(XANAX) tablet 0.25 mg  0.25 mg Oral TID PRN Altamese DillingVaibhavkumar Vachhani, MD   0.25 mg at 07/07/16 0839  . aspirin EC tablet 81 mg  81 mg Oral Daily Altamese DillingVaibhavkumar Vachhani, MD   81 mg at 07/08/16 1102  . calcium carbonate (TUMS - dosed in mg elemental calcium) chewable tablet 600 mg of elemental calcium  3 tablet Oral BID Adrian SaranSital Mody, MD   600 mg of elemental calcium at 07/08/16 1102  . cefTRIAXone (ROCEPHIN) 1 g in dextrose 5 % 50 mL IVPB  1 g Intravenous Q24H Altamese DillingVaibhavkumar Vachhani, MD   1 g at 07/07/16 1708  . cholecalciferol (VITAMIN D) tablet 1,000 Units  1,000 Units Oral Daily Altamese DillingVaibhavkumar Vachhani, MD   1,000 Units at 07/08/16 1100  . citalopram (CELEXA) tablet 10 mg  10 mg Oral Daily Altamese DillingVaibhavkumar Vachhani, MD   10 mg at 07/08/16 1101  . guaiFENesin (MUCINEX) 12 hr tablet 600 mg  600 mg Oral BID PRN Altamese DillingVaibhavkumar Vachhani, MD       And  . dextromethorphan (DELSYM) 30 MG/5ML liquid 30 mg  30 mg Oral BID PRN Altamese DillingVaibhavkumar Vachhani, MD      .  docusate sodium (COLACE) capsule 100 mg  100 mg Oral BID PRN Altamese Dilling, MD   100 mg at 07/07/16 0840  . feeding supplement (ENSURE ENLIVE) (ENSURE ENLIVE) liquid 237 mL  237 mL Oral BID BM Altamese Dilling, MD   237 mL at 07/08/16 1100  . fludrocortisone (FLORINEF) tablet 0.05 mg  0.05 mg Oral Q breakfast Sital Mody, MD   0.05 mg at 07/08/16 0827  . fluticasone (FLONASE) 50 MCG/ACT nasal spray 2 spray  2 spray Each Nare QHS Altamese Dilling, MD   2 spray at 07/06/16 2200  . heparin injection 5,000 Units  5,000 Units Subcutaneous Q8H Altamese Dilling, MD   5,000 Units at 07/08/16 0511  . insulin aspart (novoLOG) injection 0-9 Units  0-9 Units  Subcutaneous TID WC Altamese Dilling, MD   2 Units at 07/08/16 1610  . polyethylene glycol (MIRALAX / GLYCOLAX) packet 17 g  17 g Oral Daily PRN Altamese Dilling, MD      . pravastatin (PRAVACHOL) tablet 40 mg  40 mg Oral q1800 Altamese Dilling, MD   40 mg at 07/07/16 1708  . senna-docusate (Senokot-S) tablet 2 tablet  2 tablet Oral Daily Altamese Dilling, MD   2 tablet at 07/08/16 1100  . sodium chloride flush (NS) 0.9 % injection 3 mL  3 mL Intravenous Q12H Altamese Dilling, MD   3 mL at 07/08/16 1000  . travoprost (benzalkonium) (TRAVATAN) 0.004 % ophthalmic solution 1 drop  1 drop Both Eyes QHS Altamese Dilling, MD         Discharge Medications: Please see discharge summary for a list of discharge medications.  Relevant Imaging Results:  Relevant Lab Results:   Additional Information  (SSN: [id number])  Jodi Best, Jodi Crocker, LCSW

## 2016-07-08 NOTE — Progress Notes (Addendum)
[facility] - [facility]   PATIENT NAME: Jodi Best    MR#:  [id number]  DATE OF BIRTH:  04-28-1929  SUBJECTIVE:  Patient still confused but somewhat improved.   REVIEW OF SYSTEMS:    ROS  Confused Tolerating Diet: yes      DRUG ALLERGIES:   Allergies  Allergen Reactions  . Codeine     unlnown  . Penicillins     Has patient had a PCN reaction causing immediate rash, facial/tongue/throat swelling, SOB or lightheadedness with hypotension: no Has patient had a PCN reaction causing severe rash involving mucus membranes or skin necrosis: no Has patient had a PCN reaction that required hospitalization no Has patient had a PCN reaction occurring within the last 10 years: yes If all of the above answers are "NO", then may proceed with Cephalosporin use.     VITALS:  Blood pressure (!) 114/58, pulse 71, temperature 98.9 F (37.2 C), resp. rate 18, height 5\' 3"  (1.6 m), weight 72.8 kg (160 lb 6.4 oz), SpO2 96 %.  PHYSICAL EXAMINATION:   Physical Exam  Constitutional: She is well-developed, well-nourished, and in no distress. No distress.  HENT:  Head: Normocephalic.  Eyes: No scleral icterus.  Neck: Normal range of motion. Neck supple. No JVD present. No tracheal deviation present.  Cardiovascular: Normal rate, regular rhythm and normal heart sounds.  Exam reveals no gallop and no friction rub.   No murmur heard. Pulmonary/Chest: Effort normal and breath sounds normal. No respiratory distress. She has no wheezes. She has no rales. She exhibits no tenderness.  Abdominal: Soft. Bowel sounds are normal. She exhibits no distension and no mass. There is no tenderness. There is no rebound and no guarding.  Musculoskeletal: Normal range of motion. She exhibits no edema.  Neurological: She is alert.  Skin: Skin is warm. No rash noted. No erythema.      LABORATORY PANEL:   CBC  Recent Labs Lab 07/08/16 0343  WBC 14.2*  HGB 10.0*  HCT  31.1*  PLT 456*   ------------------------------------------------------------------------------------------------------------------  Chemistries   Recent Labs Lab 07/07/16 0351  07/08/16 0343 07/08/16 0801  NA 123*  < > 128* 127*  K 4.2  --  4.4  --   CL 95*  --  99*  --   CO2 18*  --  19*  --   GLUCOSE 96  --  198*  --   BUN 13  --  16  --   CREATININE 1.38*  --  1.14*  --   CALCIUM 7.6*  --  7.9*  --   MG 2.4  --   --   --   < > = values in this interval not displayed. ------------------------------------------------------------------------------------------------------------------  Cardiac Enzymes No results for input(s): TROPONINI in the last 168 hours. ------------------------------------------------------------------------------------------------------------------  RADIOLOGY:  Dg Chest 2 View  Result Date: 07/06/2016 CLINICAL DATA:  Acute onset of syncope.  Initial encounter. EXAM: CHEST  2 VIEW COMPARISON:  Chest radiograph from 05/16/2016 FINDINGS: The lungs are well-aerated. There is elevation of the right hemidiaphragm. Peribronchial thickening is noted. Patchy left basilar airspace opacity raises concern for mild pneumonia, depending on the patient's symptoms. There is no evidence of pleural effusion or pneumothorax. The heart is normal in size; the mediastinal contour is within normal limits. No acute osseous abnormalities are seen. IMPRESSION: Elevation of the right hemidiaphragm. Peribronchial thickening seen. Patchy left basilar airspace opacity raises concern for mild pneumonia, depending on the patient's symptoms. Electronically Signed  By: Roanna Raider M.D.   On: 07/06/2016 16:42   Ct Head Wo Contrast  Result Date: 07/07/2016 CLINICAL DATA:  Confusion EXAM: CT HEAD WITHOUT CONTRAST TECHNIQUE: Contiguous axial images were obtained from the base of the skull through the vertex without intravenous contrast. COMPARISON:  None. FINDINGS: Brain: Moderate atrophy.  Hypodensity in the cerebral white matter bilaterally compatible with chronic microvascular ischemia. Negative for acute infarct. Negative for hemorrhage or mass. No shift of the midline structures. Vascular: No hyperdense vessel or unexpected calcification. Skull: Negative Sinuses/Orbits: Negative Other: None IMPRESSION: Atrophy and chronic microvascular ischemic change. No acute abnormality. Electronically Signed   By: Marlan Palau M.D.   On: 07/07/2016 10:38     ASSESSMENT AND PLAN:   83 year old female with history of Addison's disease and diabetes who presented with syncopal event and now found to have severe hyponatremia.  1. Severe hyponatremia history of Addison's disease:. Continue 50 mg by mouth twice a day of Solu-Cortef for today then  then 20 mg in the morning and 10 mg in the evening starting tomorrow Continue Sodium levels every 6 hours 127 this am from 128 previously Continue IV fluids Nephrology consultation appreciated  2. Syncope due to vasovagal episode from straining while having bowel movement  3. Hypokalemia: Resolved with replacement  4. Acute cystitis: Ok to stop Rocephin as urine culture negative  5. Acute metabolic encephalopathy in the setting of UTI and hyponatremia CT Head negative for ICH or CVA. Improving mental status  6. Diabetes: Sliding scale insulin  7. Depression: Continue Celexa  8. Essential hypertension: Plan to restart metoprolol in am if BP continues to improve       Management plans discussed with the patient's daugheter and she is in agreement.  CODE STATUS: FULL  TOTAL TIME TAKING CARE OF THIS PATIENT: 24 minutes.     POSSIBLE D/C 2-3 days, DEPENDING ON CLINICAL CONDITION.   Adja Ruff M.D on 07/08/2016 at 11:39 AM  Between 7am to 6pm - Pager - [phone] After 6pm go to [email or web address] - password [email or web address]  [facility]  Office  [phone]  CC: Primary care physician; [facility]  C  Note: This dictation was prepared with Dragon dictation along with smaller phrase technology. Any transcriptional errors that result from this process are unintentional.

## 2016-07-09 LAB — GLUCOSE, CAPILLARY
GLUCOSE-CAPILLARY: 107 mg/dL — AB (ref 65–99)
GLUCOSE-CAPILLARY: 163 mg/dL — AB (ref 65–99)
Glucose-Capillary: 114 mg/dL — ABNORMAL HIGH (ref 65–99)
Glucose-Capillary: 228 mg/dL — ABNORMAL HIGH (ref 65–99)

## 2016-07-09 LAB — SODIUM
SODIUM: 128 mmol/L — AB (ref 135–145)
SODIUM: 129 mmol/L — AB (ref 135–145)
Sodium: 128 mmol/L — ABNORMAL LOW (ref 135–145)
Sodium: 128 mmol/L — ABNORMAL LOW (ref 135–145)

## 2016-07-09 MED ORDER — METOPROLOL TARTRATE 50 MG PO TABS
50.0000 mg | ORAL_TABLET | Freq: Two times a day (BID) | ORAL | Status: DC
  Administered 2016-07-09 – 2016-07-10 (×2): 50 mg via ORAL
  Filled 2016-07-09 (×2): qty 1

## 2016-07-09 MED ORDER — HYDROCORTISONE 10 MG PO TABS
20.0000 mg | ORAL_TABLET | Freq: Every day | ORAL | Status: DC
  Administered 2016-07-09 – 2016-07-10 (×2): 20 mg via ORAL
  Filled 2016-07-09: qty 2
  Filled 2016-07-09: qty 1

## 2016-07-09 MED ORDER — HYDROCORTISONE 10 MG PO TABS
10.0000 mg | ORAL_TABLET | Freq: Every evening | ORAL | Status: DC
  Administered 2016-07-09: 10 mg via ORAL
  Filled 2016-07-09: qty 1

## 2016-07-09 NOTE — Plan of Care (Signed)
Problem: Education: Goal: Knowledge of [facility] Education information/materials will improve Pt continues to be confused at times. Pts metoporol held due to low BP, other VSS, no c/o pain at this time. Family at bedside, [facility].

## 2016-07-09 NOTE — Progress Notes (Signed)
Subjective:   Patient feels better today Na 128->127 -> 125->128 States appetite is OK No N/V reported  Objective:  Vital signs in last 24 hours:  Temp:  [97.8 F (36.6 C)-98.7 F (37.1 C)] 97.8 F (36.6 C) (03/07 1119) Pulse Rate:  [80-91] 80 (03/07 1119) Resp:  [18-19] 19 (03/07 0501) BP: (111-131)/(38-52) 113/51 (03/07 1119) SpO2:  [93 %-100 %] 96 % (03/07 1119)  Weight change:  Filed Weights   07/06/16 1504 07/06/16 1957  Weight: 75.8 kg (167 lb) 72.8 kg (160 lb 6.4 oz)    Intake/Output:    Intake/Output Summary (Last 24 hours) at 07/09/16 1159 Last data filed at 07/09/16 1022  Gross per 24 hour  Intake              480 ml  Output                0 ml  Net              480 ml     Physical Exam: General: Elderly lady, sitting u p in bed  HEENT Moist oral mucous membranes  Neck supple  Pulm/lungs Normal breathing effort, clear to auscultation  CVS/Heart irregular rhythm  Abdomen:  Soft, nontender  Extremities: No peripheral edema  Neurologic: Alert, oriented to self and family, following commands  Skin: Normal turgor          Basic Metabolic Panel:   Recent Labs Lab 07/06/16 1525 07/07/16 0351  07/08/16 0343 07/08/16 0801 07/08/16 1409 07/08/16 1923 07/09/16 0106 07/09/16 0759  NA 128* 123*  < > 128* 127* 125* 126* 128* 128*  K 3.1* 4.2  --  4.4  --   --   --   --   --   CL 98* 95*  --  99*  --   --   --   --   --   CO2 18* 18*  --  19*  --   --   --   --   --   GLUCOSE 92 96  --  198*  --   --   --   --   --   BUN 14 13  --  16  --   --   --   --   --   CREATININE 1.33* 1.38*  --  1.14*  --   --   --   --   --   CALCIUM 7.8* 7.6*  --  7.9*  --   --   --   --   --   MG 1.1* 2.4  --   --   --   --   --   --   --   < > = values in this interval not displayed.   CBC:  Recent Labs Lab 07/06/16 1525 07/07/16 0351 07/08/16 0343  WBC 13.6* 14.3* 14.2*  HGB 10.5* 9.4* 10.0*  HCT 33.1* 28.7* 31.1*  MCV 85.7 85.2 84.3  PLT 410 417 456*       Microbiology:  Recent Results (from the past 720 hour(s))  Urine culture     Status: None   Collection Time: 07/06/16  4:56 PM  Result Value Ref Range Status   Specimen Description URINE, RANDOM  Final   Special Requests NONE  Final   Culture   Final    NO GROWTH Performed at [facility], [address], [place], [place] [address]    Report Status 07/08/2016 FINAL  Final  Culture, blood (routine x 2)     Status: None (Preliminary result)   Collection Time: 07/06/16  5:47 PM  Result Value Ref Range Status   Specimen Description BLOOD LEFT ANTECUBITAL  Final   Special Requests   Final    BOTTLES DRAWN AEROBIC AND ANAEROBIC AER 7CC, ANA 9CC   Culture NO GROWTH 3 DAYS  Final   Report Status PENDING  Incomplete  Culture, blood (routine x 2)     Status: None (Preliminary result)   Collection Time: 07/06/16  5:47 PM  Result Value Ref Range Status   Specimen Description BLOOD RESISTANT HAND  Final   Special Requests BOTTLES DRAWN AEROBIC AND ANAEROBIC BCLV  Final   Culture NO GROWTH 3 DAYS  Final   Report Status PENDING  Incomplete    Coagulation Studies: No results for input(s): LABPROT, INR in the last 72 hours.  Urinalysis:  Recent Labs  07/06/16 1525  COLORURINE YELLOW*  LABSPEC 1.013  PHURINE 5.0  GLUCOSEU NEGATIVE  HGBUR NEGATIVE  BILIRUBINUR NEGATIVE  KETONESUR NEGATIVE  PROTEINUR 30*  NITRITE NEGATIVE  LEUKOCYTESUR MODERATE*      Imaging: No results found.   Medications:    . aspirin EC  81 mg Oral Daily  . calcium carbonate  3 tablet Oral BID  . cholecalciferol  1,000 Units Oral Daily  . citalopram  10 mg Oral Daily  . feeding supplement (GLUCERNA SHAKE)  237 mL Oral BID BM  . fludrocortisone  0.05 mg Oral BID  . fluticasone  2 spray Each Nare QHS  . heparin  5,000 Units Subcutaneous Q8H  . hydrocortisone  10 mg Oral QPM  . hydrocortisone  20 mg Oral Q breakfast  . insulin aspart  0-9 Units Subcutaneous TID WC  . metoprolol  50 mg  Oral BID  . pravastatin  40 mg Oral q1800  . senna-docusate  2 tablet Oral Daily  . sodium chloride flush  3 mL Intravenous Q12H  . travoprost (benzalkonium)  1 drop Both Eyes QHS   ALPRAZolam, guaiFENesin **AND** dextromethorphan, docusate sodium, polyethylene glycol  Assessment/ Plan:  83 y.o. female with Addison's disease, diabetes type 2, hypertension, recurrent UTIs, hyperlipidemia, osteoarthritis, obesity, history of laminectomy, chronic kidney disease stage III  1.  Hyponatremia 2.  Chronic kidney disease stage III with baseline creatinine 1.3/GFR 35 3.  Addison's disease.  Cortisol level is less than 0.4 4.  Urinary tract infection  plan: Patient's chronic kidney disease stable Creatinine is at baseline The cause for acute hyponatremia is not entirely clear but likely diuretics or addisons. Diuretics are on hold Continue florinef BID D/c NS   LOS: 2 Keiri Solano 3/7/201811:59 AM

## 2016-07-09 NOTE — Progress Notes (Signed)
Per MD patient may be stable for D/C tomorrow. Plan is for patient to D/C to [facility]. [facility] is aware of above.   [employer] Hughes IncorporatedBailey Zahid Carneiro, LCSW [phone]

## 2016-07-09 NOTE — Progress Notes (Signed)
[facility] [place] [facility]   PATIENT NAME: Jodi Best    MR#:  [id number]  DATE OF BIRTH:  Dec 04, 1928  SUBJECTIVE:  No acute events overnight. Daughter bedside reports still some confusion however much improved since admission  Patient not able to sleep overnight   REVIEW OF SYSTEMS:    ROS  A little confused this am  Tolerating Diet: yes      DRUG ALLERGIES:   Allergies  Allergen Reactions  . Codeine     unlnown  . Penicillins     Has patient had a PCN reaction causing immediate rash, facial/tongue/throat swelling, SOB or lightheadedness with hypotension: no Has patient had a PCN reaction causing severe rash involving mucus membranes or skin necrosis: no Has patient had a PCN reaction that required hospitalization no Has patient had a PCN reaction occurring within the last 10 years: yes If all of the above answers are "NO", then may proceed with Cephalosporin use.     VITALS:  Blood pressure (!) 131/52, pulse 87, temperature 98.1 F (36.7 C), temperature source Axillary, resp. rate 19, height 5\' 3"  (1.6 m), weight 72.8 kg (160 lb 6.4 oz), SpO2 95 %.  PHYSICAL EXAMINATION:   Physical Exam  Constitutional: She is well-developed, well-nourished, and in no distress. No distress.  HENT:  Head: Normocephalic.  Eyes: No scleral icterus.  Neck: Normal range of motion. Neck supple. No JVD present. No tracheal deviation present.  Cardiovascular: Normal rate, regular rhythm and normal heart sounds.  Exam reveals no gallop and no friction rub.   No murmur heard. Pulmonary/Chest: Effort normal and breath sounds normal. No respiratory distress. She has no wheezes. She has no rales. She exhibits no tenderness.  Abdominal: Soft. Bowel sounds are normal. She exhibits no distension and no mass. There is no tenderness. There is no rebound and no guarding.  Musculoskeletal: Normal range of motion. She exhibits no edema.  Neurological: She is alert.   Skin: Skin is warm. No rash noted. No erythema.      LABORATORY PANEL:   CBC  Recent Labs Lab 07/08/16 0343  WBC 14.2*  HGB 10.0*  HCT 31.1*  PLT 456*   ------------------------------------------------------------------------------------------------------------------  Chemistries   Recent Labs Lab 07/07/16 0351  07/08/16 0343  07/09/16 0759  NA 123*  < > 128*  < > 128*  K 4.2  --  4.4  --   --   CL 95*  --  99*  --   --   CO2 18*  --  19*  --   --   GLUCOSE 96  --  198*  --   --   BUN 13  --  16  --   --   CREATININE 1.38*  --  1.14*  --   --   CALCIUM 7.6*  --  7.9*  --   --   MG 2.4  --   --   --   --   < > = values in this interval not displayed. ------------------------------------------------------------------------------------------------------------------  Cardiac Enzymes No results for input(s): TROPONINI in the last 168 hours. ------------------------------------------------------------------------------------------------------------------  RADIOLOGY:  Ct Head Wo Contrast  Result Date: 07/07/2016 CLINICAL DATA:  Confusion EXAM: CT HEAD WITHOUT CONTRAST TECHNIQUE: Contiguous axial images were obtained from the base of the skull through the vertex without intravenous contrast. COMPARISON:  None. FINDINGS: Brain: Moderate atrophy. Hypodensity in the cerebral white matter bilaterally compatible with chronic microvascular ischemia. Negative for acute infarct. Negative for hemorrhage or  mass. No shift of the midline structures. Vascular: No hyperdense vessel or unexpected calcification. Skull: Negative Sinuses/Orbits: Negative Other: None IMPRESSION: Atrophy and chronic microvascular ischemic change. No acute abnormality. Electronically Signed   By: Marlan Palau M.D.   On: 07/07/2016 10:38     ASSESSMENT AND PLAN:   83 year old female with history of Addison's disease and diabetes who presented with syncopal event and now found to have severe  hyponatremia.  1. Severe hyponatremia With history of Addison's disease: Multifactorial due to hypovolemia , HCTZ and Addison's disease  Start Solu-Cortef 20 mg in the morning and 10 mg in the evening and Florinef twice a day Continue Sodium levels every 6 hours  Nephrology consultation appreciated  2. Syncope due to vasovagal episode from straining while having bowel movement  3. Hypokalemia: Resolved with replacement  4. Acute cystitis:Rocephin was is continued  as urine culture negative  5. Acute metabolic encephalopathy in the setting of UTI and hyponatremia CT Head negative for ICH or CVA. Improving mental status,  6. Diabetes: Sliding scale insulin  7. Depression: Continue Celexa  8. Essential hypertension: Start metoprolol, holding HCTZ for now due to hyponatremia       Management plans discussed with the patient's daugheter and she is in agreement.  CODE STATUS: FULL  TOTAL TIME TAKING CARE OF THIS PATIENT: 22 minutes.     POSSIBLE D/C 1-2 days, DEPENDING ON CLINICAL CONDITION.   Jodi Best M.D on 07/09/2016 at 8:47 AM  Between 7am to 6pm - Pager - [phone] After 6pm go to [email or web address] - password [email or web address]  [facility]  Office  [phone]  CC: Primary care physician; [facility] Acute [facility]  Note: This dictation was prepared with Dragon dictation along with smaller phrase technology. Any transcriptional errors that result from this process are unintentional.

## 2016-07-09 NOTE — Progress Notes (Signed)
Physical Therapy Treatment Patient Details Name: Jodi Best MRN: [id number] DOB: 29-Apr-1929 Today's Date: 07/09/2016    History of Present Illness Jodi Best  is a 83 y.o. female with a known history of Diabetes, hyperlipidemia, hypertension, osteoarthritis, recent admission for pneumonia and bacteremia, sent to rehabilitation with IV antibiotic which she finished the course and came home 2 weeks ago. At her baseline she is now walking with a walker, completely alert and oriented, lives with her daughter in an apartment. Yesterday while she was on the bathroom, she passed out and daughter was there so she brought to the emergency room. On further questioning, she denies any excessive cough, shortness of breath, chest pain, palpitation, urinary symptoms. In ER she was found to have low potassium and sodium level and low magnesium level, also noted to have UTI. Pt is very confused this AM and is AOx1. Unable to provide history. Attempted to contact daughter via telephone but phone service states number was disconnected. History obtained via medical record.     PT Comments    Pt remains confused but improved from initial evaluation. She continues to have difficulty remaining upright in sitting at EOB and continually falls backwards. Repeated strength testing and unable to find any focal weakness. Balance difficulty likely due to confusion as she requires continual cues and assist to come back up to sitting. She continues to require modA+2 assist for transfers with rolling walker and once standing leans heavily to her left side. She is unsafe to attempt ambulation at this time but is able to perform a standing pivot transfer to recliner with arm rest dropped on recliner. RN educated about how to safely transfer back to bed. Pt left with call bell in lap. Pt will benefit from skilled PT services to address deficits in strength, balance, and mobility in order to return to full function at home.     Follow Up Recommendations  SNF     Equipment Recommendations  None recommended by PT    Recommendations for Other Services       Precautions / Restrictions Precautions Precautions: Fall Restrictions Weight Bearing Restrictions: No    Mobility  Bed Mobility Overal bed mobility: Needs Assistance Bed Mobility: Supine to Sit;Sit to Supine     Supine to sit: Min assist Sit to supine: Min assist   General bed mobility comments: Pt requires assist to pull up to upright from supine. Once at EOB she continues to fall over backwards requiring assist and cues to remain upright  Transfers Overall transfer level: Needs assistance Equipment used: Rolling walker (2 wheeled) Transfers: Sit to/from Stand Sit to Stand: Mod assist;+2 physical assistance         General transfer comment: Pt requires modA+1 for sit to stand with rolling walker. Once in standing pt leans heavily to her L side and is unable to self correct despite cues from therapist. Performed sit to stand transfer training with patient x 2. She is unable to ambulate at this time due to poor balance. Performed standing pivot transfers from bed to recliner with assist from +2.  Ambulation/Gait                 Stairs            Wheelchair Mobility    Modified Rankin (Stroke Patients Only)       Balance Overall balance assessment: Needs assistance Sitting-balance support: Bilateral upper extremity supported;Feet supported Sitting balance-Leahy Scale: Poor Sitting balance - Comments: Continually falls to the  R and attempts to lay back down when sitting at EOB. Becomes tearful that therapist makes pt remain upright in sitting to perform orthstatic vitals   Standing balance support: Bilateral upper extremity supported Standing balance-Leahy Scale: Poor                      Cognition Arousal/Alertness: Awake/alert Behavior During Therapy: Flat affect Overall Cognitive Status: No  family/caregiver present to determine baseline cognitive functioning                 General Comments: Pt is currently confused. AOx2, however unable to recall year of birth. Able to state that it is currently March but unable to report that it is 2018. Pt continually falls over backwards while sitting up at EOB and cannot seem to understand that she is falling backwards    Exercises General Exercises - Lower Extremity Long Arc Quad: Strengthening;Both;15 reps;Seated Heel Slides: Strengthening;Both;15 reps;Seated Hip ABduction/ADduction: Strengthening;Both;15 reps;Seated Hip Flexion/Marching: Strengthening;Both;15 reps;Seated    General Comments        Pertinent Vitals/Pain Pain Assessment: No/denies pain    Home Living                      Prior Function            PT Goals (current goals can now be found in the care plan section) Acute Rehab PT Goals Patient Stated Goal: Unable to provide PT Goal Formulation: Patient unable to participate in goal setting    Frequency    Min 2X/week      PT Plan Current plan remains appropriate    Co-evaluation             End of Session Equipment Utilized During Treatment: Gait belt Activity Tolerance: Other (comment) (Limited due to confusion) Patient left: with call bell/phone within reach;in chair;with chair alarm set;with SCD's reapplied Nurse Communication: Mobility status PT Visit Diagnosis: Unsteadiness on feet (R26.81);Muscle weakness (generalized) (M62.81)     Time: [id number] PT Time Calculation (min) (ACUTE ONLY): 28 min  Charges:  $Therapeutic Exercise: 8-22 mins $Therapeutic Activity: 8-22 mins                    G Codes:      Jodi InkJason D Britnee Best PT, DPT   Jodi Best 07/09/2016, 11:14 AM

## 2016-07-10 LAB — GLUCOSE, CAPILLARY
GLUCOSE-CAPILLARY: 189 mg/dL — AB (ref 65–99)
Glucose-Capillary: 110 mg/dL — ABNORMAL HIGH (ref 65–99)

## 2016-07-10 LAB — BASIC METABOLIC PANEL
Anion gap: 6 (ref 5–15)
BUN: 20 mg/dL (ref 6–20)
CALCIUM: 8.2 mg/dL — AB (ref 8.9–10.3)
CHLORIDE: 105 mmol/L (ref 101–111)
CO2: 21 mmol/L — AB (ref 22–32)
CREATININE: 1.09 mg/dL — AB (ref 0.44–1.00)
GFR calc non Af Amer: 45 mL/min — ABNORMAL LOW (ref >60)
GFR, EST AFRICAN AMERICAN: 52 mL/min — AB (ref >60)
GLUCOSE: 114 mg/dL — AB (ref 65–99)
Potassium: 3.9 mmol/L (ref 3.5–5.1)
Sodium: 132 mmol/L — ABNORMAL LOW (ref 135–145)

## 2016-07-10 LAB — SODIUM: Sodium: 128 mmol/L — ABNORMAL LOW (ref 135–145)

## 2016-07-10 MED ORDER — FLUDROCORTISONE ACETATE 0.1 MG PO TABS: 0.0500 mg | ORAL_TABLET | Freq: Two times a day (BID) | ORAL | 0 refills | Status: AC

## 2016-07-10 MED ORDER — ALPRAZOLAM 0.25 MG PO TABS: 0.2500 mg | ORAL_TABLET | Freq: Three times a day (TID) | ORAL | 0 refills | Status: AC | PRN

## 2016-07-10 MED ORDER — HYDROCORTISONE 10 MG PO TABS: 10.0000 mg | ORAL_TABLET | Freq: Every evening | ORAL | 0 refills | Status: AC

## 2016-07-10 NOTE — Progress Notes (Signed)
Physical Therapy Treatment Patient Details Name: Jodi Best MRN: [id number] DOB: 1928/08/29 Today's Date: 07/10/2016    History of Present Illness Jodi Best  is a 83 y.o. female with a known history of Diabetes, hyperlipidemia, hypertension, osteoarthritis, recent admission for pneumonia and bacteremia, sent to rehabilitation with IV antibiotic which she finished the course and came home 2 weeks ago. At her baseline she is now walking with a walker, completely alert and oriented, lives with her daughter in an apartment. Yesterday while she was on the bathroom, she passed out and daughter was there so she brought to the emergency room. On further questioning, she denies any excessive cough, shortness of breath, chest pain, palpitation, urinary symptoms. In ER she was found to have low potassium and sodium level and low magnesium level, also noted to have UTI. Pt is very confused this AM and is AOx1. Unable to provide history. Attempted to contact daughter via telephone but phone service states number was disconnected. History obtained via medical record.     PT Comments    Pt with considerably improved cognition on this date. Her static sitting balance as well as standing balance are both much improved today. She is still too unsteady to ambulate however she is able to take a few small side steps at the EOB. She is able to complete all seated and supine exercises as instructed. Plan is to discharge to SNF today. Pt will benefit from skilled PT services to address deficits in strength, balance, and mobility in order to return to full function at home.     Follow Up Recommendations  SNF     Equipment Recommendations  None recommended by PT    Recommendations for Other Services       Precautions / Restrictions Precautions Precautions: Fall Restrictions Weight Bearing Restrictions: No    Mobility  Bed Mobility Overal bed mobility: Needs Assistance Bed Mobility: Supine to Sit;Sit  to Supine     Supine to sit: Min assist Sit to supine: Min assist   General bed mobility comments: Pt requires assist to pull up to upright from supine. Once at EOB she demonstrates improved sitting static balance requiring supervision only. When returning to bed she requires bilateral LE assist  Transfers Overall transfer level: Needs assistance Equipment used: Rolling walker (2 wheeled) Transfers: Sit to/from Stand Sit to Stand: Min assist         General transfer comment: Pt demonstrates notable improvement with transfers. She continues to demonstrate poor anterior weight shifting, supporting the back of her legs on the bed. Once upright she is able to gradually improve her standing balance. CGA mostly with intermittent minA+1 due to balance disturbance. Attempted forward ambulation however pt too unsteady to perform. She is able to take a few small side steps [facility] but unable to truly ambulate safely at this time  Ambulation/Gait                 Stairs            Wheelchair Mobility    Modified Rankin (Stroke Patients Only)       Balance Overall balance assessment: Needs assistance Sitting-balance support: Bilateral upper extremity supported;Feet supported Sitting balance-Leahy Scale: Fair     Standing balance support: Bilateral upper extremity supported Standing balance-Leahy Scale: Fair Standing balance comment: Mostly CGA but intermittent minA+1 due to posterior LOB                    Cognition  Arousal/Alertness: Awake/alert Behavior During Therapy: WFL for tasks assessed/performed Overall Cognitive Status: Within Functional Limits for tasks assessed                 General Comments: AOx3, close to baseline per daughter    Exercises General Exercises - Lower Extremity Ankle Circles/Pumps: Both;15 reps;Supine;AROM Quad Sets: Strengthening;Both;15 reps;Supine Gluteal Sets: Strengthening;Both;15 reps;Supine Long Arc Quad:  Strengthening;Both;15 reps;Seated Heel Slides: Strengthening;Both;15 reps;Seated Hip ABduction/ADduction: Strengthening;Both;15 reps;Seated;Other (comment) (x 15 in supine for both abduction and adduction) Straight Leg Raises: Strengthening;Both;15 reps;Supine Hip Flexion/Marching: Strengthening;Both;15 reps;Seated;Other (comment) (Standing marches x 15 but difficulty clearing toes) Heel Raises: Strengthening;Both;15 reps;Seated    General Comments        Pertinent Vitals/Pain Pain Assessment: No/denies pain    Home Living                      Prior Function            PT Goals (current goals can now be found in the care plan section) Acute Rehab PT Goals Patient Stated Goal: Unable to provide Progress towards PT goals: Progressing toward goals    Frequency    Min 2X/week      PT Plan Current plan remains appropriate    Co-evaluation             End of Session Equipment Utilized During Treatment: Gait belt Activity Tolerance: Patient tolerated treatment well Patient left: with call bell/phone within reach;with SCD's reapplied;in bed;with bed alarm set;with family/visitor present   PT Visit Diagnosis: Unsteadiness on feet (R26.81);Muscle weakness (generalized) (M62.81)     Time: 1030-1055 PT Time Calculation (min) (ACUTE ONLY): 25 min  Charges:  $Therapeutic Exercise: 8-22 mins $Therapeutic Activity: 8-22 mins                    G Codes:      Sharalyn Ink Isla Sabree PT, DPT   Darry Kelnhofer 07/10/2016, 11:13 AM

## 2016-07-10 NOTE — Progress Notes (Signed)
Patient is medically stable for D/C to [facility] today. [facility] today to room 412. RN will call report and arrange EMS for transport. Clinical Child psychotherapistocial Worker (CSW) sent D/C orders to [facility] via [facility]. Patient is aware of above. Patient's daughter Maren BeachRolanda is at bedside and aware of D/C today. Please reconsult if future social work needs arise. CSW signing off.   B[employer] Hughes IncorporatedBailey Shun Pletz, LCSW [phone]

## 2016-07-10 NOTE — Progress Notes (Signed)
This Clinical research associatewriter called report to [facility], spoke to Sprint Nextel Corporationngela,RN. PIV removed from pt's r fa with catheter intact. Daughter at bedside anticipating transport.

## 2016-07-10 NOTE — Progress Notes (Signed)
Pt dc'd at 1310 via ems for [facility].

## 2016-07-10 NOTE — Discharge Summary (Signed)
[facility] - [place] [facility]   PATIENT NAME: Jodi Best    MR#:  [id number]  DATE OF BIRTH:  1929/02/21  DATE OF ADMISSION:  07/06/2016 ADMITTING PHYSICIAN: Altamese Dilling, MD  DATE OF DISCHARGE: 07/10/2016  PRIMARY CARE PHYSICIAN: [facility]    ADMISSION DIAGNOSIS:  Syncope and collapse [R55] Hypomagnesemia [E83.42] Long QT interval [R94.31] Urinary tract infection without hematuria, site unspecified [N39.0]  DISCHARGE DIAGNOSIS:  Principal Problem:   Syncope Active Problems:   UTI (urinary tract infection)   Syncopal episodes   Hyponatremia   SECONDARY DIAGNOSIS:   Past Medical History:  Diagnosis Date  . Addison disease (HCC)   . Cataract   . Diabetes mellitus without complication (HCC)   . Hyperlipemia   . Hypertension   . Osteoarthritis     HOSPITAL COURSE:   83 year old female with history of Addison's disease and diabetes who presented with syncopal event and now found to have severe hyponatremia.  1. Severe hyponatremia With history of Addison's disease: This was felt to be multifactorial due to hypovolemia , HCTZ and Addison's disease. She will continue Solu-Cortef 20 mg in the morning and 10 mg in the evening and Florinef twice a day She will follow up with nephrology and endocrinology in 3-5 days at discharge.  Sodium level has improved off of IV fluids.  2. Syncope due to vasovagal episode from straining while having bowel movement.  3. Hypokalemia: This has Resolved with replacement.  4. Acute cystitis:Rocephin was was discontinued as urine culture negative  5. Acute metabolic encephalopathy in the setting of UTI and hyponatremia CT Head negative for ICH or CVA. She is at her baseline.  6. Diabetes: Resume outpatient medications with ADA diet  7. Depression: Continue Celexa  8. Essential hypertension: She may continue metoprolol, holding HCTZ for now due to hyponatremia. She will need  follow-up for sodium level in 3 days.   DISCHARGE CONDITIONS AND DIET:   Stable for discharge and diabetic diet  CONSULTS OBTAINED:  Treatment Team:  Mosetta Pigeon, MD  DRUG ALLERGIES:   Allergies  Allergen Reactions  . Codeine     unlnown  . Penicillins     Has patient had a PCN reaction causing immediate rash, facial/tongue/throat swelling, SOB or lightheadedness with hypotension: no Has patient had a PCN reaction causing severe rash involving mucus membranes or skin necrosis: no Has patient had a PCN reaction that required hospitalization no Has patient had a PCN reaction occurring within the last 10 years: yes If all of the above answers are "NO", then may proceed with Cephalosporin use.     DISCHARGE MEDICATIONS:   Current Discharge Medication List    START taking these medications   Details  fludrocortisone (FLORINEF) 0.1 MG tablet Take 0.5 tablets (0.05 mg total) by mouth 2 (two) times daily. Qty: 60 tablet, Refills: 0    !! hydrocortisone (CORTEF) 10 MG tablet Take 1 tablet (10 mg total) by mouth every evening. Qty: 30 tablet, Refills: 0     !! - Potential duplicate medications found. Please discuss with provider.    CONTINUE these medications which have NOT CHANGED   Details  amLODipine (NORVASC) 10 MG tablet Take 10 mg by mouth daily. Refills: 1    aspirin EC 81 MG tablet Take 81 mg by mouth daily.    calcium carbonate (OSCAL) 1500 (600 Ca) MG TABS tablet Take 1,500 mg by mouth 2 (two) times daily with a meal.    Cholecalciferol 1000  UNITS capsule Take 1,000 Units by mouth daily.    citalopram (CELEXA) 10 MG tablet Take 1 tablet (10 mg total) by mouth daily. Qty: 30 tablet, Refills: 2    fluticasone (FLONASE) 50 MCG/ACT nasal spray Place 2 sprays into both nostrils at bedtime.     !! hydrocortisone (CORTEF) 20 MG tablet Take 20 mg by mouth daily.    lovastatin (MEVACOR) 40 MG tablet Take 40 mg by mouth at bedtime.    metFORMIN (GLUCOPHAGE) 500 MG  tablet Take by mouth 2 (two) times daily with a meal.    metoprolol (LOPRESSOR) 50 MG tablet Take 1 tablet (50 mg total) by mouth 2 (two) times daily. Qty: 60 tablet, Refills: 2    senna-docusate (SENOKOT-S) 8.6-50 MG tablet Take 2 tablets by mouth daily. Qty: 30 tablet, Refills: 0    travoprost, benzalkonium, (TRAVATAN) 0.004 % ophthalmic solution 1 drop at bedtime.    ALPRAZolam (XANAX) 0.25 MG tablet Take 1 tablet (0.25 mg total) by mouth 3 (three) times daily as needed for anxiety. Qty: 20 tablet, Refills: 0    feeding supplement, ENSURE ENLIVE, (ENSURE ENLIVE) LIQD Take 237 mLs by mouth 2 (two) times daily between meals. Qty: 237 mL, Refills: 12    polyethylene glycol (MIRALAX / GLYCOLAX) packet Take 17 g by mouth daily as needed for mild constipation. Qty: 14 each, Refills: 0     !! - Potential duplicate medications found. Please discuss with provider.    STOP taking these medications     dextromethorphan-guaiFENesin (MUCINEX DM) 30-600 MG 12hr tablet      furosemide (LASIX) 40 MG tablet      hydrochlorothiazide (HYDRODIURIL) 25 MG tablet      ceFAZolin (ANCEF) 2-4 GM/100ML-% IVPB           Today   CHIEF COMPLAINT:   No issues overnight. Sodium level is 132 without IV fluids.   VITAL SIGNS:  Blood pressure 137/70, pulse 68, temperature 97.3 F (36.3 C), temperature source Oral, resp. rate 20, height 5\' 3"  (1.6 m), weight 72.8 kg (160 lb 6.4 oz), SpO2 93 %.   REVIEW OF SYSTEMS:  Review of Systems  Constitutional: Negative.  Negative for chills, fever and malaise/fatigue.  HENT: Negative.  Negative for ear discharge, ear pain, hearing loss, nosebleeds and sore throat.   Eyes: Negative.  Negative for blurred vision and pain.  Respiratory: Negative.  Negative for cough, hemoptysis, shortness of breath and wheezing.   Cardiovascular: Negative.  Negative for chest pain, palpitations and leg swelling.  Gastrointestinal: Negative.  Negative for abdominal pain,  blood in stool, diarrhea, nausea and vomiting.  Genitourinary: Negative.  Negative for dysuria.  Musculoskeletal: Negative.  Negative for back pain.  Skin: Negative.   Neurological: Negative for dizziness, tremors, speech change, focal weakness, seizures and headaches.  Endo/Heme/Allergies: Negative.  Does not bruise/bleed easily.  Psychiatric/Behavioral: Positive for memory loss. Negative for depression, hallucinations and suicidal ideas.     PHYSICAL EXAMINATION:  GENERAL:  83 y.o.-year-old patient lying in the bed with no acute distress.  NECK:  Supple, no jugular venous distention. No thyroid enlargement, no tenderness.  LUNGS: Normal breath sounds bilaterally, no wheezing, rales,rhonchi  No use of accessory muscles of respiration.  CARDIOVASCULAR: S1, S2 normal. No murmurs, rubs, or gallops.  ABDOMEN: Soft, non-tender, non-distended. Bowel sounds present. No organomegaly or mass.  EXTREMITIES: No pedal edema, cyanosis, or clubbing.  PSYCHIATRIC: The patient is alert and oriented xName and place.  SKIN: No obvious rash, lesion, or ulcer.  DATA REVIEW:   CBC  Recent Labs Lab 07/08/16 0343  WBC 14.2*  HGB 10.0*  HCT 31.1*  PLT 456*    Chemistries   Recent Labs Lab 07/07/16 0351  07/10/16 0730  NA 123*  < > 132*  K 4.2  < > 3.9  CL 95*  < > 105  CO2 18*  < > 21*  GLUCOSE 96  < > 114*  BUN 13  < > 20  CREATININE 1.38*  < > 1.09*  CALCIUM 7.6*  < > 8.2*  MG 2.4  --   --   < > = values in this interval not displayed.  Cardiac Enzymes No results for input(s): TROPONINI in the last 168 hours.  Microbiology Results  @MICRORSLT48 @  RADIOLOGY:  No results found.    Current Discharge Medication List    START taking these medications   Details  fludrocortisone (FLORINEF) 0.1 MG tablet Take 0.5 tablets (0.05 mg total) by mouth 2 (two) times daily. Qty: 60 tablet, Refills: 0    !! hydrocortisone (CORTEF) 10 MG tablet Take 1 tablet (10 mg total) by mouth every  evening. Qty: 30 tablet, Refills: 0     !! - Potential duplicate medications found. Please discuss with provider.    CONTINUE these medications which have NOT CHANGED   Details  amLODipine (NORVASC) 10 MG tablet Take 10 mg by mouth daily. Refills: 1    aspirin EC 81 MG tablet Take 81 mg by mouth daily.    calcium carbonate (OSCAL) 1500 (600 Ca) MG TABS tablet Take 1,500 mg by mouth 2 (two) times daily with a meal.    Cholecalciferol 1000 UNITS capsule Take 1,000 Units by mouth daily.    citalopram (CELEXA) 10 MG tablet Take 1 tablet (10 mg total) by mouth daily. Qty: 30 tablet, Refills: 2    fluticasone (FLONASE) 50 MCG/ACT nasal spray Place 2 sprays into both nostrils at bedtime.     !! hydrocortisone (CORTEF) 20 MG tablet Take 20 mg by mouth daily.    lovastatin (MEVACOR) 40 MG tablet Take 40 mg by mouth at bedtime.    metFORMIN (GLUCOPHAGE) 500 MG tablet Take by mouth 2 (two) times daily with a meal.    metoprolol (LOPRESSOR) 50 MG tablet Take 1 tablet (50 mg total) by mouth 2 (two) times daily. Qty: 60 tablet, Refills: 2    senna-docusate (SENOKOT-S) 8.6-50 MG tablet Take 2 tablets by mouth daily. Qty: 30 tablet, Refills: 0    travoprost, benzalkonium, (TRAVATAN) 0.004 % ophthalmic solution 1 drop at bedtime.    ALPRAZolam (XANAX) 0.25 MG tablet Take 1 tablet (0.25 mg total) by mouth 3 (three) times daily as needed for anxiety. Qty: 20 tablet, Refills: 0    feeding supplement, ENSURE ENLIVE, (ENSURE ENLIVE) LIQD Take 237 mLs by mouth 2 (two) times daily between meals. Qty: 237 mL, Refills: 12    polyethylene glycol (MIRALAX / GLYCOLAX) packet Take 17 g by mouth daily as needed for mild constipation. Qty: 14 each, Refills: 0     !! - Potential duplicate medications found. Please discuss with provider.    STOP taking these medications     dextromethorphan-guaiFENesin (MUCINEX DM) 30-600 MG 12hr tablet      furosemide (LASIX) 40 MG tablet      hydrochlorothiazide  (HYDRODIURIL) 25 MG tablet      ceFAZolin (ANCEF) 2-4 GM/100ML-% IVPB           Management plans discussed with the patient and daughter  and she is in agreement. Stable for discharge SNF  Patient should follow up with pcp  CODE STATUS:     Code Status Orders        Start     Ordered   07/06/16 1945  Full code  Continuous     07/06/16 1944    Code Status History    Date Active Date Inactive Code Status Order ID Comments User Context   05/16/2016 12:09 PM 05/22/2016  4:22 PM Full Code [id number]  Auburn Bilberry, MD Inpatient      TOTAL TIME TAKING CARE OF THIS PATIENT: 37 minutes.    Note: This dictation was prepared with Dragon dictation along with smaller phrase technology. Any transcriptional errors that result from this process are unintentional.  Diamantina Edinger M.D on 07/10/2016 at 10:29 AM  Between 7am to 6pm - Pager - [phone] After 6pm go to [email or web address] - Social research officer, government  [facility]  Office  [phone]  CC: Primary care physician; [facility]

## 2016-07-10 NOTE — Clinical Social Work Placement (Signed)
   CLINICAL SOCIAL WORK PLACEMENT  NOTE  Date:  07/10/2016  Patient Details  Name: Jodi Best L Hardge MRN: [id number] Date of Birth: [birth year]  Clinical Social Work is seeking post-discharge placement for this patient at the Skilled  Nursing Facility level of care (*CSW will initial, date and re-position this form in  chart as items are completed):  Yes   Patient/family provided with [place] Clinical Social Work Department's list of facilities offering this level of care within the geographic area requested by the patient (or if unable, by the patient's family).  Yes   Patient/family informed of their freedom to choose among providers that offer the needed level of care, that participate in Medicare, Medicaid or managed care program needed by the patient, have an available bed and are willing to accept the patient.  Yes   Patient/family informed of [place] [facility]'s ownership interest [facility] and [facility], as well as of the fact that they are under no obligation to receive care at these facilities.  PASRR submitted to EDS on       PASRR number received on       Existing PASRR number confirmed on 07/08/16     FL2 transmitted to all facilities in geographic area requested by pt/family on 07/08/16     FL2 transmitted to all facilities within larger geographic area on       Patient informed that his/her managed care company has contracts with or will negotiate with certain facilities, including the following:        Yes   Patient/family informed of bed offers received.  Patient chooses bed at  [facility] )     Physician recommends and patient chooses bed at      Patient to be transferred to  [facility] ) on 07/10/16.  Patient to be transferred to facility by  [facility] )     Patient family notified on 07/10/16 of transfer.  Name of family member notified:   (Patient's daughter Maren BeachRolanda is at bedside and aware of D/C today.  )      PHYSICIAN       Additional Comment:    _______________________________________________ Tanieka Pownall, Darleen CrockerBailey M, LCSW 07/10/2016, 11:54 AM

## 2016-07-10 NOTE — Progress Notes (Signed)
Shift assessment completed. Pt is awake, alert to self, place, and time of day. Daughter at bedside. Pt is on room air, denied pain, respirations are unlabored and shallow. HR is regular. Abdomen is soft, bs hypoactive, pt is wearing in continence brief. Ppp, pt has soft non pitting edema to bilat ankles, scd's are in place. PIV #20 intact to r posterior forearm, site is free of redness and swelling. Srx2, call bell in reach.

## 2016-07-10 NOTE — Plan of Care (Signed)
Problem: Bowel/Gastric: Goal: Will not experience complications related to bowel motility Outcome: Completed/Met Date Met: 07/10/16 Pt has met all goals for d/c to snf.

## 2016-07-11 LAB — CULTURE, BLOOD (ROUTINE X 2)
CULTURE: NO GROWTH
CULTURE: NO GROWTH

## 2017-08-14 IMAGING — CT CT HEAD W/O CM
3 series · 16 of 47 positions shown, 19 images · non-contrast
Comparison: None.

CLINICAL DATA: Confusion

EXAM:
CT HEAD WITHOUT CONTRAST
TECHNIQUE: Contiguous axial images were obtained from the base of the skull
through the vertex without intravenous contrast.

[Series 2: head wo · axial · 0.42mm/px · z∈[+480,+605]mm · 10 of 30 slices shown, 13 images]
[im 3/30  brain]
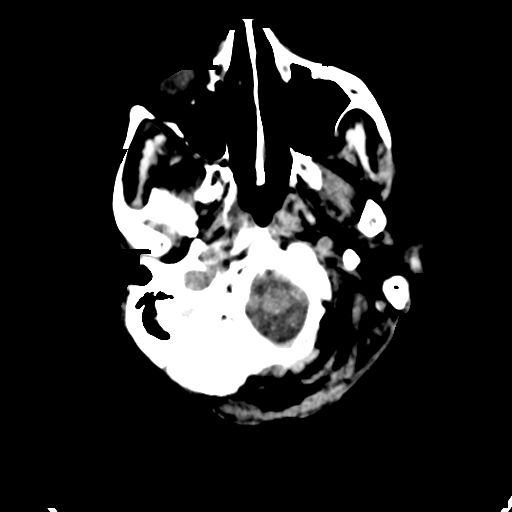
[im 3/30  bone]
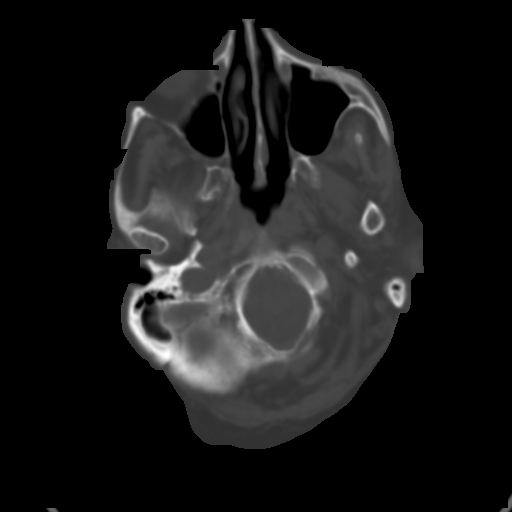
[im 6/30  brain]
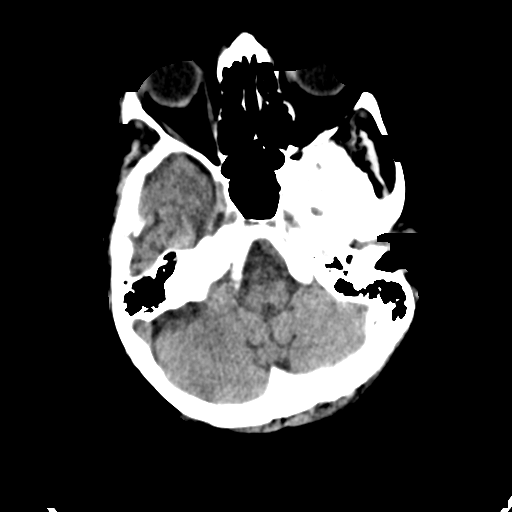
[im 9/30  brain]
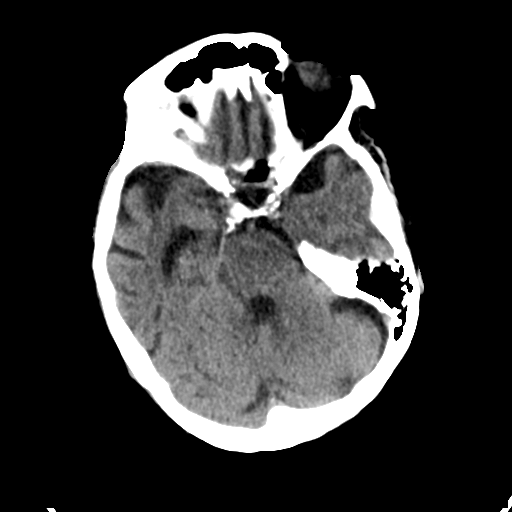
[im 11/30  brain]
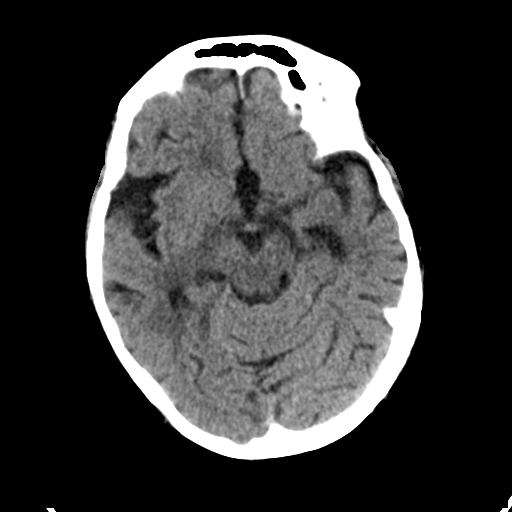
[im 14/30  brain]
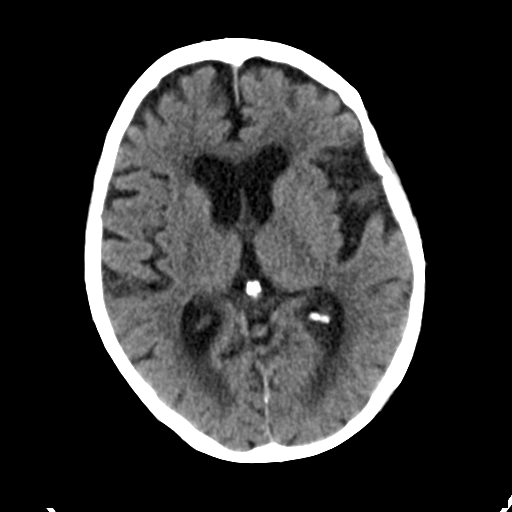
[im 14/30  bone]
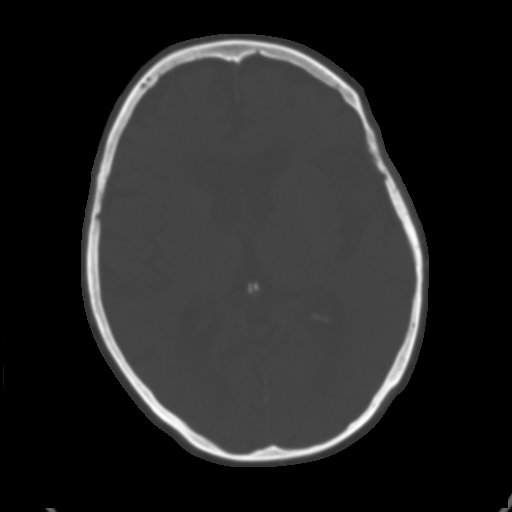
[im 17/30  brain]
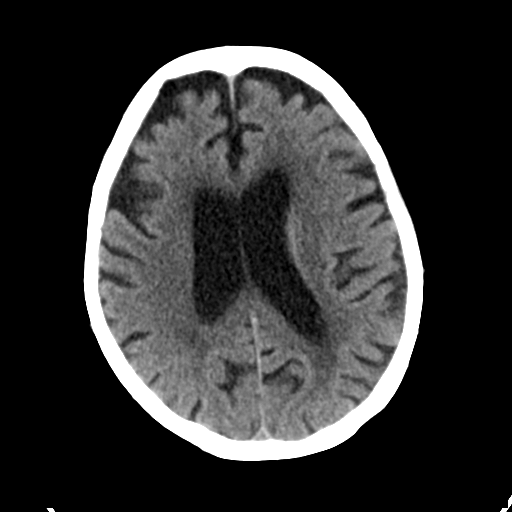
[im 20/30  brain]
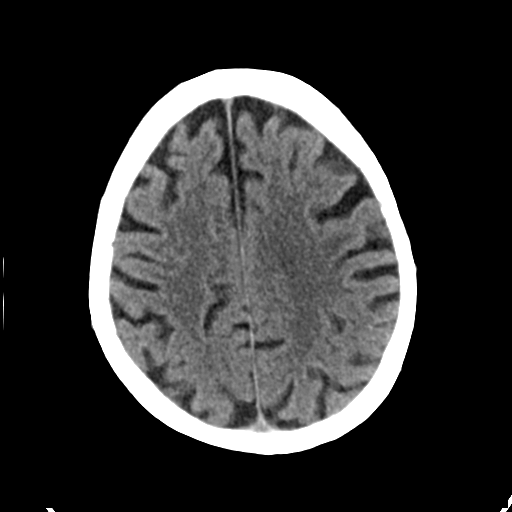
[im 23/30  brain]
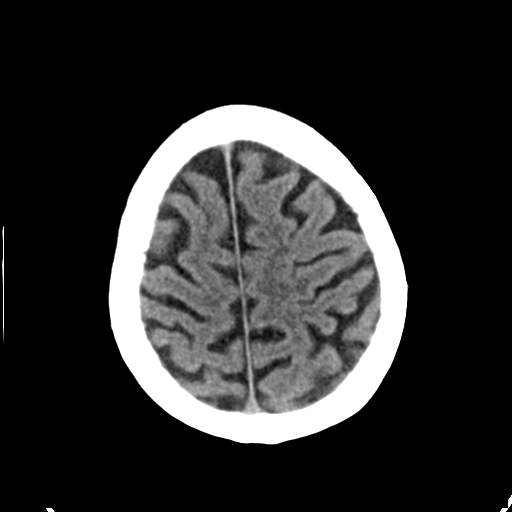
[im 25/30  brain]
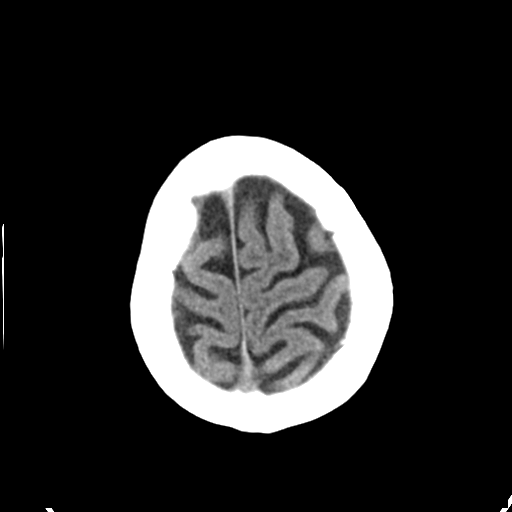
[im 25/30  bone]
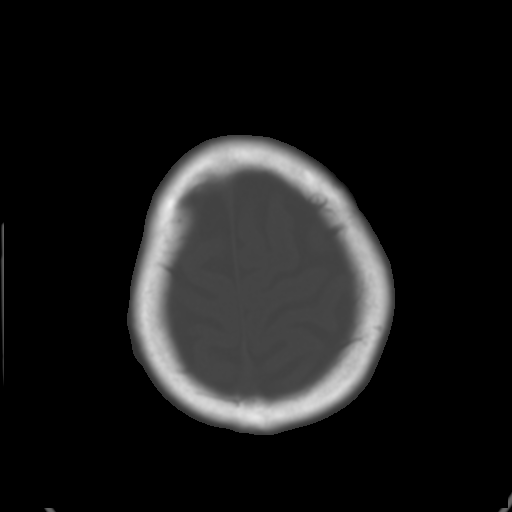
[im 28/30  brain]
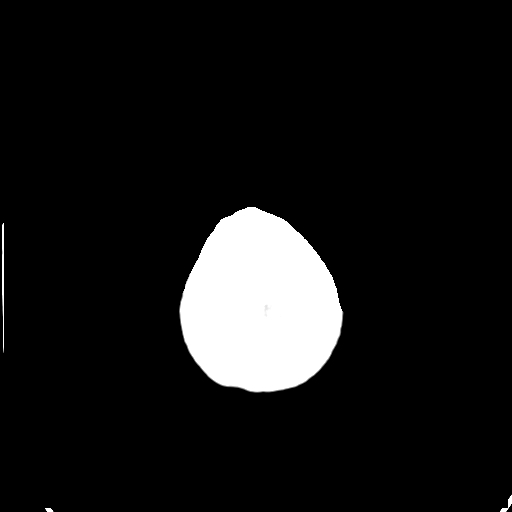

[Series 4: coronal soft tissue · coronal · 0.30mm/px · 3 of 62 slices shown]
[im 21/62  brain]
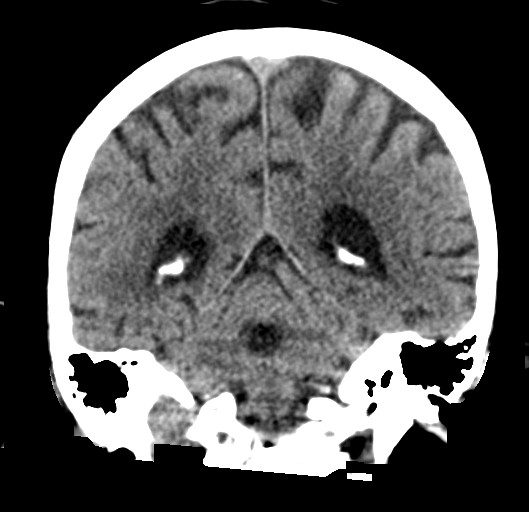
[im 28/62  brain]
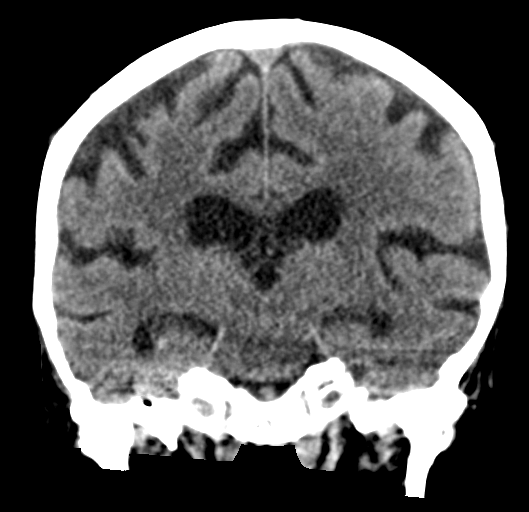
[im 34/62  brain]
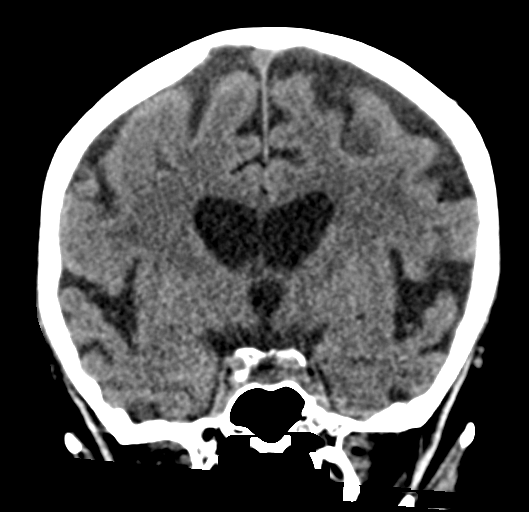

[Series 5: sagittal soft tissue · sagittal · 0.30mm/px · 3 of 50 slices shown]
[im 17/50  brain]
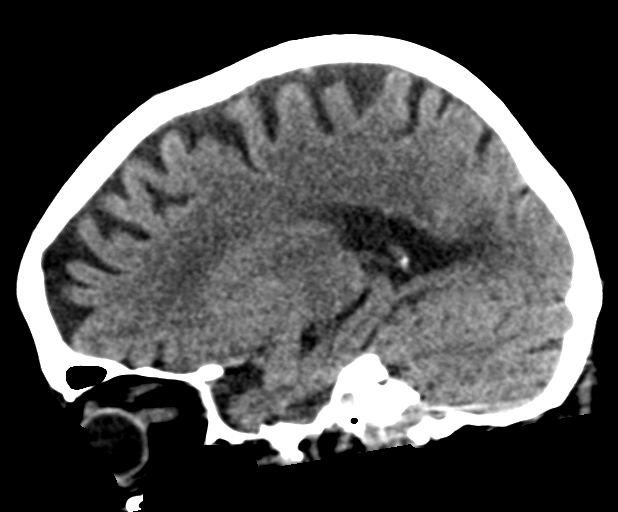
[im 25/50  brain]
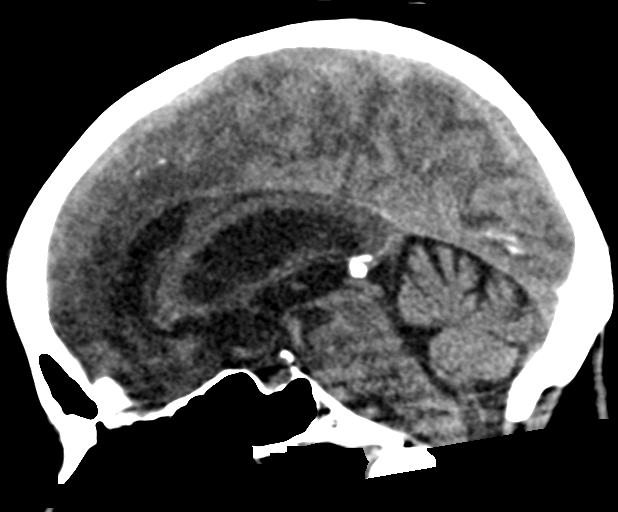
[im 33/50  brain]
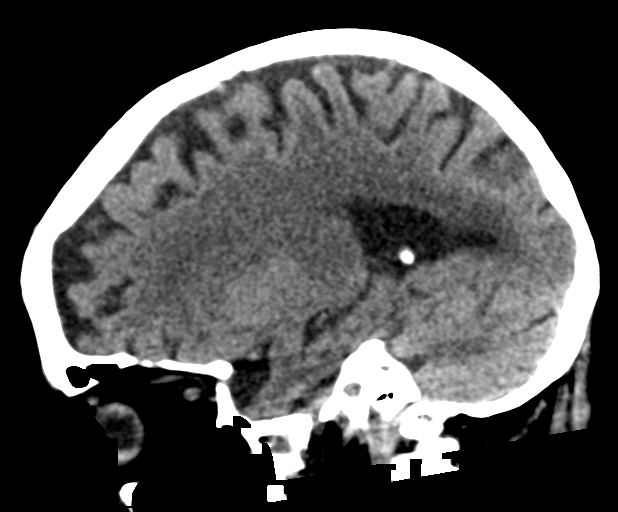

[16 of 47 positions shown; findings below may reference images not displayed]

FINDINGS: Brain: Moderate atrophy. Hypodensity in the cerebral white matter
bilaterally compatible with chronic microvascular ischemia. Negative
for acute infarct. Negative for hemorrhage or mass. No shift of the
midline structures.

Vascular: No hyperdense vessel or unexpected calcification.

Skull: Negative

Sinuses/Orbits: Negative

Other: None
IMPRESSION: Atrophy and chronic microvascular ischemic change. No acute
abnormality.

## 2019-06-06 DEATH — deceased
